# Patient Record
Sex: Female | Born: 1955 | Race: White | Hispanic: No | Marital: Married | Smoking: Never smoker
Health system: Southern US, Community
[De-identification: ages and names within clinical notes are randomized; demographics above are authoritative.]

## PROBLEM LIST (undated history)

## (undated) DIAGNOSIS — R7303 Prediabetes: Secondary | ICD-10-CM

## (undated) DIAGNOSIS — E559 Vitamin D deficiency, unspecified: Secondary | ICD-10-CM

## (undated) DIAGNOSIS — E785 Hyperlipidemia, unspecified: Secondary | ICD-10-CM

## (undated) DIAGNOSIS — J189 Pneumonia, unspecified organism: Secondary | ICD-10-CM

## (undated) DIAGNOSIS — N201 Calculus of ureter: Secondary | ICD-10-CM

## (undated) DIAGNOSIS — R350 Frequency of micturition: Secondary | ICD-10-CM

## (undated) DIAGNOSIS — Z87442 Personal history of urinary calculi: Secondary | ICD-10-CM

## (undated) DIAGNOSIS — K219 Gastro-esophageal reflux disease without esophagitis: Secondary | ICD-10-CM

## (undated) DIAGNOSIS — I10 Essential (primary) hypertension: Secondary | ICD-10-CM

## (undated) HISTORY — PX: CATARACT EXTRACTION W/ INTRAOCULAR LENS IMPLANT: SHX1309

---

## 1996-04-09 HISTORY — PX: TOTAL ABDOMINAL HYSTERECTOMY W/ BILATERAL SALPINGOOPHORECTOMY: SHX83

## 1997-08-19 ENCOUNTER — Other Ambulatory Visit: Admission: RE | Admit: 1997-08-19 | Discharge: 1997-08-19 | Payer: Self-pay | Admitting: Gynecology

## 1998-10-17 ENCOUNTER — Other Ambulatory Visit: Admission: RE | Admit: 1998-10-17 | Discharge: 1998-10-17 | Payer: Self-pay | Admitting: Gynecology

## 1999-12-28 ENCOUNTER — Other Ambulatory Visit: Admission: RE | Admit: 1999-12-28 | Discharge: 1999-12-28 | Payer: Self-pay | Admitting: Gynecology

## 2000-12-18 ENCOUNTER — Other Ambulatory Visit: Admission: RE | Admit: 2000-12-18 | Discharge: 2000-12-18 | Payer: Self-pay | Admitting: Gynecology

## 2001-12-31 ENCOUNTER — Other Ambulatory Visit: Admission: RE | Admit: 2001-12-31 | Discharge: 2001-12-31 | Payer: Self-pay | Admitting: Gynecology

## 2003-01-14 ENCOUNTER — Other Ambulatory Visit: Admission: RE | Admit: 2003-01-14 | Discharge: 2003-01-14 | Payer: Self-pay | Admitting: Gynecology

## 2004-01-14 ENCOUNTER — Encounter: Admission: RE | Admit: 2004-01-14 | Discharge: 2004-01-14 | Payer: Self-pay | Admitting: Gynecology

## 2004-01-17 ENCOUNTER — Other Ambulatory Visit: Admission: RE | Admit: 2004-01-17 | Discharge: 2004-01-17 | Payer: Self-pay | Admitting: Gynecology

## 2004-10-23 ENCOUNTER — Encounter: Admission: RE | Admit: 2004-10-23 | Discharge: 2004-10-23 | Payer: Self-pay | Admitting: Family Medicine

## 2005-02-02 ENCOUNTER — Other Ambulatory Visit: Admission: RE | Admit: 2005-02-02 | Discharge: 2005-02-02 | Payer: Self-pay | Admitting: Gynecology

## 2005-02-22 ENCOUNTER — Encounter: Admission: RE | Admit: 2005-02-22 | Discharge: 2005-02-22 | Payer: Self-pay | Admitting: Gynecology

## 2006-08-08 ENCOUNTER — Other Ambulatory Visit: Admission: RE | Admit: 2006-08-08 | Discharge: 2006-08-08 | Payer: Self-pay | Admitting: Family Medicine

## 2006-08-08 ENCOUNTER — Encounter: Admission: RE | Admit: 2006-08-08 | Discharge: 2006-08-08 | Payer: Self-pay | Admitting: Physician Assistant

## 2009-01-21 ENCOUNTER — Encounter: Admission: RE | Admit: 2009-01-21 | Discharge: 2009-01-21 | Payer: Self-pay | Admitting: Gynecology

## 2009-01-28 ENCOUNTER — Encounter: Admission: RE | Admit: 2009-01-28 | Discharge: 2009-01-28 | Payer: Self-pay | Admitting: Gynecology

## 2010-04-30 ENCOUNTER — Encounter: Payer: Self-pay | Admitting: Gynecology

## 2012-04-03 ENCOUNTER — Ambulatory Visit
Admission: RE | Admit: 2012-04-03 | Discharge: 2012-04-03 | Disposition: A | Discharge status: Home / Self Care | Payer: BC Managed Care – PPO | Source: Ambulatory Visit | Attending: Family Medicine | Admitting: Family Medicine

## 2012-04-03 ENCOUNTER — Other Ambulatory Visit: Payer: Self-pay | Admitting: Family Medicine

## 2012-04-03 DIAGNOSIS — M25531 Pain in right wrist: Secondary | ICD-10-CM

## 2014-01-29 ENCOUNTER — Ambulatory Visit
Admission: RE | Admit: 2014-01-29 | Discharge: 2014-01-29 | Disposition: A | Discharge status: Home / Self Care | Payer: 59 | Source: Ambulatory Visit | Attending: Family Medicine | Admitting: Family Medicine

## 2014-01-29 ENCOUNTER — Other Ambulatory Visit: Payer: Self-pay | Admitting: Family Medicine

## 2014-01-29 DIAGNOSIS — M545 Low back pain: Secondary | ICD-10-CM

## 2014-04-09 HISTORY — PX: CATARACT EXTRACTION W/ INTRAOCULAR LENS IMPLANT: SHX1309

## 2014-05-17 ENCOUNTER — Other Ambulatory Visit: Payer: Self-pay

## 2014-05-17 DIAGNOSIS — Z803 Family history of malignant neoplasm of breast: Secondary | ICD-10-CM

## 2014-05-17 DIAGNOSIS — Z1231 Encounter for screening mammogram for malignant neoplasm of breast: Secondary | ICD-10-CM

## 2014-05-20 ENCOUNTER — Ambulatory Visit
Admission: RE | Admit: 2014-05-20 | Discharge: 2014-05-20 | Disposition: A | Discharge status: Home / Self Care | Payer: 59 | Source: Ambulatory Visit

## 2014-05-20 DIAGNOSIS — Z1231 Encounter for screening mammogram for malignant neoplasm of breast: Secondary | ICD-10-CM

## 2014-05-20 DIAGNOSIS — Z803 Family history of malignant neoplasm of breast: Secondary | ICD-10-CM

## 2015-06-09 ENCOUNTER — Other Ambulatory Visit: Payer: Self-pay

## 2015-06-09 DIAGNOSIS — Z1231 Encounter for screening mammogram for malignant neoplasm of breast: Secondary | ICD-10-CM

## 2015-07-01 ENCOUNTER — Ambulatory Visit
Admission: RE | Admit: 2015-07-01 | Discharge: 2015-07-01 | Disposition: A | Discharge status: Home / Self Care | Payer: 59 | Source: Ambulatory Visit

## 2015-07-01 DIAGNOSIS — Z1231 Encounter for screening mammogram for malignant neoplasm of breast: Secondary | ICD-10-CM

## 2016-04-09 DEATH — deceased

## 2017-06-20 ENCOUNTER — Other Ambulatory Visit: Payer: Self-pay | Admitting: Family Medicine

## 2017-06-20 DIAGNOSIS — Z1231 Encounter for screening mammogram for malignant neoplasm of breast: Secondary | ICD-10-CM

## 2017-06-24 ENCOUNTER — Ambulatory Visit
Admission: RE | Admit: 2017-06-24 | Discharge: 2017-06-24 | Disposition: A | Discharge status: Home / Self Care | Payer: 59 | Source: Ambulatory Visit | Attending: Family Medicine | Admitting: Family Medicine

## 2017-06-24 DIAGNOSIS — Z1231 Encounter for screening mammogram for malignant neoplasm of breast: Secondary | ICD-10-CM

## 2017-09-07 ENCOUNTER — Emergency Department (HOSPITAL_COMMUNITY): Payer: 59

## 2017-09-07 ENCOUNTER — Encounter (HOSPITAL_COMMUNITY): Payer: Self-pay | Admitting: Emergency Medicine

## 2017-09-07 ENCOUNTER — Emergency Department (HOSPITAL_COMMUNITY)
Admission: EM | Admit: 2017-09-07 | Discharge: 2017-09-07 | Disposition: A | Discharge status: Home / Self Care | Payer: 59 | Attending: Emergency Medicine | Admitting: Emergency Medicine

## 2017-09-07 DIAGNOSIS — N23 Unspecified renal colic: Secondary | ICD-10-CM | POA: Diagnosis not present

## 2017-09-07 DIAGNOSIS — R1084 Generalized abdominal pain: Secondary | ICD-10-CM | POA: Diagnosis present

## 2017-09-07 LAB — I-STAT CHEM 8, ED
BUN: 19 mg/dL (ref 6–20)
Calcium, Ion: 1.1 mmol/L — ABNORMAL LOW (ref 1.15–1.40)
Chloride: 102 mmol/L (ref 101–111)
Creatinine, Ser: 0.9 mg/dL (ref 0.44–1.00)
Glucose, Bld: 160 mg/dL — ABNORMAL HIGH (ref 65–99)
HCT: 40 % (ref 36.0–46.0)
Hemoglobin: 13.6 g/dL (ref 12.0–15.0)
Potassium: 3.7 mmol/L (ref 3.5–5.1)
Sodium: 140 mmol/L (ref 135–145)
TCO2: 25 mmol/L (ref 22–32)

## 2017-09-07 LAB — URINALYSIS, ROUTINE W REFLEX MICROSCOPIC
Bacteria, UA: NONE SEEN
Bilirubin Urine: NEGATIVE
Glucose, UA: NEGATIVE mg/dL
Ketones, ur: NEGATIVE mg/dL
Nitrite: NEGATIVE
Protein, ur: NEGATIVE mg/dL
Specific Gravity, Urine: 1.013 (ref 1.005–1.030)
pH: 7 (ref 5.0–8.0)

## 2017-09-07 MED ORDER — ONDANSETRON HCL 4 MG/2ML IJ SOLN
4.0000 mg | Freq: Once | INTRAMUSCULAR | Status: AC
  Administered 2017-09-07: 4 mg via INTRAVENOUS
  Filled 2017-09-07: qty 2

## 2017-09-07 MED ORDER — HYDROMORPHONE HCL 2 MG/ML IJ SOLN
1.0000 mg | Freq: Once | INTRAMUSCULAR | Status: AC
  Administered 2017-09-07: 1 mg via INTRAVENOUS
  Filled 2017-09-07: qty 1

## 2017-09-07 MED ORDER — FENTANYL CITRATE (PF) 100 MCG/2ML IJ SOLN
50.0000 ug | INTRAMUSCULAR | Status: AC | PRN
  Administered 2017-09-07 (×2): 50 ug via INTRAVENOUS
  Filled 2017-09-07 (×2): qty 2

## 2017-09-07 MED ORDER — OXYCODONE-ACETAMINOPHEN 5-325 MG PO TABS: 1.0000 | ORAL_TABLET | ORAL | 0 refills | Status: DC | PRN

## 2017-09-07 MED ORDER — OXYCODONE-ACETAMINOPHEN 5-325 MG PO TABS
1.0000 | ORAL_TABLET | Freq: Once | ORAL | Status: DC
  Filled 2017-09-07: qty 1

## 2017-09-07 NOTE — ED Triage Notes (Signed)
Pt began having left sided flank pain.  Pt went to urgent care earlier, was told it could be a kidney infection however the pain decreased .  Pt stated around 11:30pm the pain started back, pt is unable to sit still.  She feels like she needs to urinate but is unable to.

## 2017-09-07 NOTE — ED Notes (Addendum)
Pt called x3 for vitals reassessment with no response.

## 2017-09-07 NOTE — ED Provider Notes (Signed)
[facility] Provider Note   CSN: [id number] Arrival date & time: 09/07/17  0126     History   Chief Complaint Chief Complaint  Patient presents with  . Flank Pain    HPI Melissa Hobbs is a 62 y.o. female.  Patient presents to the emergency department for evaluation of left flank pain.  Patient reports that she had some pain in the middle of the night last night, but it eased off.  She went to work today and was doing fine initially but started having pain in her left side while at work that continued through the day.  Tonight she was awakened from sleep by severe, constant, sharp and stabbing pain in the left flank with nausea but no vomiting.     History reviewed. No pertinent past medical history.  There are no active problems to display for this patient.   History reviewed. No pertinent surgical history.   OB History   None      Home Medications    Prior to Admission medications   Not on File    Family History No family history on file.  Social History Social History   Tobacco Use  . Smoking status: Not on file  Substance Use Topics  . Alcohol use: Not on file  . Drug use: Not on file     Allergies   Penicillins   Review of Systems Review of Systems  Gastrointestinal: Positive for nausea.  Genitourinary: Positive for flank pain.  All other systems reviewed and are negative.    Physical Exam Updated Vital Signs BP (!) 169/116   Pulse 88   Temp 97.7 F (36.5 C) (Oral)   Resp 20   SpO2 99%   Physical Exam  Constitutional: She is oriented to person, place, and time. She appears well-developed and well-nourished. She appears distressed.  HENT:  Head: Normocephalic and atraumatic.  Right Ear: Hearing normal.  Left Ear: Hearing normal.  Nose: Nose normal.  Mouth/Throat: Oropharynx is clear and moist and mucous membranes are normal.  Eyes: Pupils are equal, round, and reactive to light. Conjunctivae  and EOM are normal.  Neck: Normal range of motion. Neck supple.  Cardiovascular: Regular rhythm, S1 normal and S2 normal. Exam reveals no gallop and no friction rub.  No murmur heard. Pulmonary/Chest: Effort normal and breath sounds normal. No respiratory distress. She exhibits no tenderness.  Abdominal: Soft. Normal appearance and bowel sounds are normal. There is no hepatosplenomegaly. There is no tenderness. There is no rebound, no guarding, no tenderness at McBurney's point and negative Murphy's sign. No hernia.  Musculoskeletal: Normal range of motion.  Neurological: She is alert and oriented to person, place, and time. She has normal strength. No cranial nerve deficit or sensory deficit. Coordination normal. GCS eye subscore is 4. GCS verbal subscore is 5. GCS motor subscore is 6.  Skin: Skin is warm, dry and intact. No rash noted. No cyanosis.  Psychiatric: She has a normal mood and affect. Her speech is normal and behavior is normal. Thought content normal.  Nursing note and vitals reviewed.    ED Treatments / Results  Labs (all labs ordered are listed, but only abnormal results are displayed) Labs Reviewed  URINALYSIS, ROUTINE W REFLEX MICROSCOPIC - Abnormal; Notable for the following components:      Result Value   APPearance TURBID (*)    Hgb urine dipstick SMALL (*)    Leukocytes, UA TRACE (*)    All other  components within normal limits    EKG None  Radiology Ct Renal Stone Study  Result Date: 09/07/2017 CLINICAL DATA:  Acute onset of left flank pain. EXAM: CT ABDOMEN AND PELVIS WITHOUT CONTRAST TECHNIQUE: Multidetector CT imaging of the abdomen and pelvis was performed following the standard protocol without IV contrast. COMPARISON:  Lumbar spine radiographs performed 01/29/2014 FINDINGS: Lower chest: The visualized lung bases are grossly clear. The visualized portions of the mediastinum are unremarkable. Hepatobiliary: The liver is unremarkable in appearance. The  gallbladder is unremarkable in appearance. The common bile duct remains normal in caliber. Pancreas: The pancreas is within normal limits. Spleen: The spleen is unremarkable in appearance. Adrenals/Urinary Tract: The adrenal glands are unremarkable in appearance. There is mild left-sided hydronephrosis. An obstructing 2 mm stone is noted at the base of the bladder, along the left vesicoureteral junction. A relatively small staghorn calculus is noted at the right kidney, measuring 1.8 cm. Additional scattered nonobstructing bilateral renal stones are seen. Stomach/Bowel: The stomach is unremarkable in appearance. The small bowel is within normal limits. The appendix is normal in caliber, without evidence of appendicitis. The colon is unremarkable in appearance. Vascular/Lymphatic: Minimal calcification is noted at the distal abdominal aorta and its branches. No retroperitoneal or pelvic sidewall lymphadenopathy is seen. Reproductive: The bladder is mildly distended and grossly unremarkable. The patient is status post hysterectomy. No suspicious adnexal masses are seen. Other: No additional soft tissue abnormalities are seen. Musculoskeletal: No acute osseous abnormalities are identified. The visualized musculature is unremarkable in appearance. IMPRESSION: 1. Mild left-sided hydronephrosis, with an obstructing 2 mm stone noted at the base of the bladder, along the left vesicoureteral junction. 2. Additional scattered nonobstructing bilateral renal stones seen. Relatively small staghorn calculus at the right kidney, measuring 1.8 cm. Electronically Signed   By: Roanna Raider M.D.   On: 09/07/2017 03:57    Procedures Procedures (including critical care time)  Medications Ordered in ED Medications  HYDROmorphone (DILAUDID) injection 1 mg (has no administration in time range)  ondansetron (ZOFRAN) injection 4 mg (has no administration in time range)  fentaNYL (SUBLIMAZE) injection 50 mcg (50 mcg Intravenous  Given 09/07/17 0322)  ondansetron (ZOFRAN) injection 4 mg (4 mg Intravenous Given 09/07/17 0201)     Initial Impression / Assessment and Plan / ED Course  I have reviewed the triage vital signs and the nursing notes.  Pertinent labs & imaging results that were available during my care of the patient were reviewed by me and considered in my medical decision making (see chart for details).     Patient presents to the emergency department for evaluation of left flank pain.  She has had intermittent pain for 24 hours but it is now constant and severe.  CT scan confirms 2 mm distal ureteral stone with mild hydronephrosis causing her pain.  No evidence of urinary tract infection.  Patient provided with analgesia, will follow up with urology.  Final Clinical Impressions(s) / ED Diagnoses   Final diagnoses:  Ureteral colic    ED Discharge Orders    None       Gilda Crease, MD 09/07/17 [phone]

## 2019-01-07 ENCOUNTER — Other Ambulatory Visit: Payer: Self-pay | Admitting: Family Medicine

## 2019-01-07 DIAGNOSIS — R109 Unspecified abdominal pain: Secondary | ICD-10-CM

## 2020-05-27 ENCOUNTER — Ambulatory Visit (HOSPITAL_COMMUNITY): Payer: 59

## 2020-05-27 ENCOUNTER — Inpatient Hospital Stay (HOSPITAL_COMMUNITY)
Admission: EM | Admit: 2020-05-27 | Discharge: 2020-05-30 | DRG: 177 | Disposition: A | Discharge status: Home / Self Care | Payer: 59 | Attending: Internal Medicine | Admitting: Internal Medicine

## 2020-05-27 ENCOUNTER — Encounter (HOSPITAL_COMMUNITY): Payer: Self-pay

## 2020-05-27 ENCOUNTER — Ambulatory Visit (HOSPITAL_COMMUNITY)
Admission: EM | Admit: 2020-05-27 | Discharge: 2020-05-27 | Disposition: A | Discharge status: Home / Self Care | Payer: 59 | Attending: Physician Assistant | Admitting: Physician Assistant

## 2020-05-27 ENCOUNTER — Other Ambulatory Visit: Payer: Self-pay

## 2020-05-27 ENCOUNTER — Ambulatory Visit (INDEPENDENT_AMBULATORY_CARE_PROVIDER_SITE_OTHER): Payer: 59

## 2020-05-27 DIAGNOSIS — U071 COVID-19: Principal | ICD-10-CM | POA: Diagnosis present

## 2020-05-27 DIAGNOSIS — J9601 Acute respiratory failure with hypoxia: Secondary | ICD-10-CM | POA: Diagnosis present

## 2020-05-27 DIAGNOSIS — Z90710 Acquired absence of both cervix and uterus: Secondary | ICD-10-CM | POA: Diagnosis not present

## 2020-05-27 DIAGNOSIS — I10 Essential (primary) hypertension: Secondary | ICD-10-CM | POA: Diagnosis present

## 2020-05-27 DIAGNOSIS — Z880 Allergy status to penicillin: Secondary | ICD-10-CM | POA: Diagnosis not present

## 2020-05-27 DIAGNOSIS — Z79899 Other long term (current) drug therapy: Secondary | ICD-10-CM

## 2020-05-27 DIAGNOSIS — J1282 Pneumonia due to coronavirus disease 2019: Secondary | ICD-10-CM

## 2020-05-27 DIAGNOSIS — R0602 Shortness of breath: Secondary | ICD-10-CM

## 2020-05-27 DIAGNOSIS — R0902 Hypoxemia: Secondary | ICD-10-CM

## 2020-05-27 HISTORY — DX: Essential (primary) hypertension: I10

## 2020-05-27 LAB — COMPREHENSIVE METABOLIC PANEL WITH GFR
ALT: 37 U/L (ref 0–44)
AST: 41 U/L (ref 15–41)
Albumin: 3.1 g/dL — ABNORMAL LOW (ref 3.5–5.0)
Alkaline Phosphatase: 64 U/L (ref 38–126)
Anion gap: 12 (ref 5–15)
BUN: 23 mg/dL (ref 8–23)
CO2: 27 mmol/L (ref 22–32)
Calcium: 8.8 mg/dL — ABNORMAL LOW (ref 8.9–10.3)
Chloride: 99 mmol/L (ref 98–111)
Creatinine, Ser: 0.96 mg/dL (ref 0.44–1.00)
GFR, Estimated: >60 mL/min
Glucose, Bld: 121 mg/dL — ABNORMAL HIGH (ref 70–99)
Potassium: 4.1 mmol/L (ref 3.5–5.1)
Sodium: 138 mmol/L (ref 135–145)
Total Bilirubin: 0.6 mg/dL (ref 0.3–1.2)
Total Protein: 6.6 g/dL (ref 6.5–8.1)

## 2020-05-27 LAB — CBC WITH DIFFERENTIAL/PLATELET
Abs Immature Granulocytes: 0 K/uL (ref 0.00–0.07)
Basophils Absolute: 0 K/uL (ref 0.0–0.1)
Basophils Relative: 0 %
Eosinophils Absolute: 0 K/uL (ref 0.0–0.5)
Eosinophils Relative: 0 %
HCT: 39.8 % (ref 36.0–46.0)
Hemoglobin: 13.7 g/dL (ref 12.0–15.0)
Lymphocytes Relative: 15 %
Lymphs Abs: 1 K/uL (ref 0.7–4.0)
MCH: 30.8 pg (ref 26.0–34.0)
MCHC: 34.4 g/dL (ref 30.0–36.0)
MCV: 89.4 fL (ref 80.0–100.0)
Monocytes Absolute: 0.4 K/uL (ref 0.1–1.0)
Monocytes Relative: 6 %
Neutro Abs: 5.1 K/uL (ref 1.7–7.7)
Neutrophils Relative %: 79 %
Platelets: 222 K/uL (ref 150–400)
RBC: 4.45 MIL/uL (ref 3.87–5.11)
RDW: 11.6 % (ref 11.5–15.5)
WBC: 6.4 K/uL (ref 4.0–10.5)
nRBC: 0 % (ref 0.0–0.2)
nRBC: 0 /100{WBCs}

## 2020-05-27 LAB — LACTIC ACID, PLASMA
Lactic Acid, Venous: 1.7 mmol/L (ref 0.5–1.9)
Lactic Acid, Venous: 1.4 mmol/L (ref 0.5–1.9)

## 2020-05-27 LAB — TROPONIN I (HIGH SENSITIVITY)
Troponin I (High Sensitivity): 7 ng/L (ref <18)
Troponin I (High Sensitivity): 7 ng/L

## 2020-05-27 LAB — D-DIMER, QUANTITATIVE: D-Dimer, Quant: 1.62 ug/mL-FEU — ABNORMAL HIGH (ref 0.00–0.50)

## 2020-05-27 LAB — POC SARS CORONAVIRUS 2 AG -  ED: SARS Coronavirus 2 Ag: NEGATIVE

## 2020-05-27 LAB — TRIGLYCERIDES: Triglycerides: 180 mg/dL — ABNORMAL HIGH

## 2020-05-27 LAB — LACTATE DEHYDROGENASE: LDH: 313 U/L — ABNORMAL HIGH (ref 98–192)

## 2020-05-27 LAB — FERRITIN: Ferritin: 956 ng/mL — ABNORMAL HIGH (ref 11–307)

## 2020-05-27 LAB — C-REACTIVE PROTEIN: CRP: 9.4 mg/dL — ABNORMAL HIGH

## 2020-05-27 LAB — FIBRINOGEN: Fibrinogen: 700 mg/dL — ABNORMAL HIGH (ref 210–475)

## 2020-05-27 LAB — PROCALCITONIN: Procalcitonin: <0.1 ng/mL

## 2020-05-27 MED ORDER — DEXAMETHASONE 6 MG PO TABS: 6.0000 mg | ORAL_TABLET | ORAL | Status: DC

## 2020-05-27 MED ORDER — ACETAMINOPHEN 325 MG PO TABS: 650.0000 mg | ORAL_TABLET | Freq: Four times a day (QID) | ORAL | Status: DC | PRN

## 2020-05-27 MED ORDER — ALBUTEROL SULFATE HFA 108 (90 BASE) MCG/ACT IN AERS
4.0000 | INHALATION_SPRAY | Freq: Once | RESPIRATORY_TRACT | Status: AC
  Administered 2020-05-27: 4 via RESPIRATORY_TRACT

## 2020-05-27 MED ORDER — SODIUM CHLORIDE 0.9 % IV SOLN
100.0000 mg | Freq: Every day | INTRAVENOUS | Status: DC
  Administered 2020-05-28 – 2020-05-30 (×3): 100 mg via INTRAVENOUS
  Filled 2020-05-27 (×4): qty 20

## 2020-05-27 MED ORDER — SODIUM CHLORIDE 0.9 % IV SOLN
200.0000 mg | Freq: Once | INTRAVENOUS | Status: AC
  Administered 2020-05-27: 200 mg via INTRAVENOUS
  Filled 2020-05-27: qty 40

## 2020-05-27 MED ORDER — ALBUTEROL SULFATE HFA 108 (90 BASE) MCG/ACT IN AERS: 4.0000 | INHALATION_SPRAY | Freq: Once | RESPIRATORY_TRACT | Status: AC

## 2020-05-27 MED ORDER — AEROCHAMBER PLUS FLO-VU LARGE MISC
1.0000 | Freq: Once | Status: AC
  Administered 2020-05-27: 1

## 2020-05-27 MED ORDER — ENOXAPARIN SODIUM 40 MG/0.4ML ~~LOC~~ SOLN
40.0000 mg | SUBCUTANEOUS | Status: DC
  Administered 2020-05-27 – 2020-05-29 (×3): 40 mg via SUBCUTANEOUS
  Filled 2020-05-27 (×3): qty 0.4

## 2020-05-27 MED ORDER — ALBUTEROL SULFATE HFA 108 (90 BASE) MCG/ACT IN AERS
INHALATION_SPRAY | RESPIRATORY_TRACT | Status: AC
  Filled 2020-05-27: qty 6.7

## 2020-05-27 MED ORDER — DEXAMETHASONE SODIUM PHOSPHATE 10 MG/ML IJ SOLN
6.0000 mg | Freq: Once | INTRAMUSCULAR | Status: AC
  Administered 2020-05-27: 6 mg via INTRAVENOUS
  Filled 2020-05-27: qty 1

## 2020-05-27 MED ORDER — AEROCHAMBER PLUS FLO-VU LARGE MISC
Status: AC
  Filled 2020-05-27: qty 1

## 2020-05-27 NOTE — ED Notes (Signed)
Pt reports that she subjectively feels less SOB, able to speak full sentences with less dyspnea than on arrival

## 2020-05-27 NOTE — H&P (Signed)
History and Physical:    Melissa Hobbs   OIZ:[id number] DOB: 1956/01/06 DOA: 05/27/2020  Referring MD/provider: Dr. Mellody Dance PCP: [facility]   Patient coming from: Home  Chief Complaint: Fevers, chills, shortness of breath  History of Present Illness:   Melissa Hobbs is an 65 y.o. unvaccinated female who was diagnosed with SARS-CoV-2 on 05/18/2020.  Patient initially thought she was doing well although she does admit to decreased p.o. intake secondary to anorexia.  He also had some diarrhea which seems to have now resolved.  She also had some nausea.  However she thought she was doing okay until yesterday when she developed fevers and chills with associated shortness of breath and cough.  She was seen by a telemedicine doctor earlier today who told her to come into the ED.  Patient denies any abdominal pain.  Notes that her diarrhea has mostly tapered off.  She admits to some intermittent nausea.  No vomiting.  She has had decreased p.o. intake.  ED Course:  The patient was noted to be afebrile and normotensive.  She was noted to have O2 saturation of 87% on room air which corrected to the mid 90s on 3-4 L of nasal cannula.  ROS:   ROS   Review of Systems: As per HPI  Past Medical History:   Past Medical History:  Diagnosis Date  . Hypertension     Past Surgical History:   Past Surgical History:  Procedure Laterality Date  . ABDOMINAL HYSTERECTOMY      Social History:   Social History   Socioeconomic History  . Marital status: Married    Spouse name: Not on file  . Number of children: Not on file  . Years of education: Not on file  . Highest education level: Not on file  Occupational History  . Not on file  Tobacco Use  . Smoking status: Never Smoker  . Smokeless tobacco: Never Used  Vaping Use  . Vaping Use: Never used  Substance and Sexual Activity  . Alcohol use: Never  . Drug use: Never  . Sexual activity: Not on file  Other  Topics Concern  . Not on file  Social History Narrative  . Not on file   Social Determinants of Health   Financial Resource Strain: Not on file  Food Insecurity: Not on file  Transportation Needs: Not on file  Physical Activity: Not on file  Stress: Not on file  Social Connections: Not on file  Intimate Partner Violence: Not on file    Allergies   Penicillins  Family history:   No family history on file.  Current Medications:   Prior to Admission medications   Medication Sig Start Date End Date Taking? Authorizing Provider  calcium carbonate (TUMS - DOSED IN MG ELEMENTAL CALCIUM) 500 MG chewable tablet Chew 1 tablet by mouth daily.   Yes [provider]  Cholecalciferol (VITAMIN D3) 25 MCG (1000 UT) CAPS Take 1,000 Units by mouth daily.   Yes [provider]  amLODipine (NORVASC) 5 MG tablet Take 5 mg by mouth daily. Patient not taking: No sig reported 12/31/19   [provider]  oxyCODONE-acetaminophen (PERCOCET) 5-325 MG tablet Take 1 tablet by mouth every 4 (four) hours as needed. Patient not taking: Reported on 05/27/2020 09/07/17   Gilda Crease, MD    Physical Exam:   Vitals:   05/27/20 1800 05/27/20 1815 05/27/20 1830 05/27/20 1845  BP: 121/63 119/64 122/67 (!) 109/98  Pulse: 87 91 88 88  Resp: (!) 30 (!) 31 (!) 30 (!) 28  Temp:      SpO2: 93% 94% 94% 98%     Physical Exam: Blood pressure (!) 109/98, pulse 88, temperature 98.6 F (37 C), resp. rate (!) 28, SpO2 98 %. Gen: Patient appearing much older than stated age lying flat in bed in no acute distress. Eyes: sclera anicteric, conjuctiva mildly injected bilaterally CVS: S1-S2, regulary, no gallops Respiratory:  decreased air entry likely secondary to decreased inspiratory effort GI: NABS, soft, NT  LE: No edema. No cyanosis Neuro: grossly nonfocal.  Psych:mood and affect appropriate to situation.   Data Review:    Labs: Basic Metabolic Panel: Recent Labs  Lab  05/27/20 1542  NA 138  K 4.1  CL 99  CO2 27  GLUCOSE 121*  BUN 23  CREATININE 0.96  CALCIUM 8.8*   Liver Function Tests: Recent Labs  Lab 05/27/20 1542  AST 41  ALT 37  ALKPHOS 64  BILITOT 0.6  PROT 6.6  ALBUMIN 3.1*   No results for input(s): LIPASE, AMYLASE in the last 168 hours. No results for input(s): AMMONIA in the last 168 hours. CBC: Recent Labs  Lab 05/27/20 1542  WBC 6.4  NEUTROABS 5.1  HGB 13.7  HCT 39.8  MCV 89.4  PLT 222   Cardiac Enzymes: No results for input(s): CKTOTAL, CKMB, CKMBINDEX, TROPONINI in the last 168 hours.  BNP (last 3 results) No results for input(s): PROBNP in the last 8760 hours. CBG: No results for input(s): GLUCAP in the last 168 hours.  Urinalysis    Component Value Date/Time   COLORURINE YELLOW 09/07/2017 0147   APPEARANCEUR TURBID (A) 09/07/2017 0147   LABSPEC 1.013 09/07/2017 0147   PHURINE 7.0 09/07/2017 0147   GLUCOSEU NEGATIVE 09/07/2017 0147   HGBUR SMALL (A) 09/07/2017 0147   BILIRUBINUR NEGATIVE 09/07/2017 0147   KETONESUR NEGATIVE 09/07/2017 0147   PROTEINUR NEGATIVE 09/07/2017 0147   NITRITE NEGATIVE 09/07/2017 0147   LEUKOCYTESUR TRACE (A) 09/07/2017 0147      Radiographic Studies: DG Chest 2 View  Result Date: 05/27/2020 CLINICAL DATA:  Shortness of breath and decreased oxygen saturation. The patient tested positive for COVID-19 9 days ago. EXAM: CHEST - 2 VIEW COMPARISON:  None. FINDINGS: The patient has multifocal bilateral airspace disease with a peripheral predominance. No pneumothorax or pleural effusion. Heart size is normal. No acute or focal bony abnormality. IMPRESSION: Bilateral pneumonia has an appearance compatible with COVID-19 infection. Electronically Signed   By: Drusilla Kanner M.D.   On: 05/27/2020 14:38    EKG: Independently reviewed.  Sinus rhythm at 90.  Normal intervals.  Normal axis.  No acute ST-T wave changes.   Assessment/Plan:   Active Problems:   Pneumonia due to  COVID-33 virus  65 year old unvaccinated female on no medications at home is admitted with COVID-19 pneumonia.  COVID-19 pneumonia Patient was started on remdesivir and Decadron in the ED which I am continuing. She does have a D-dimer of 1.62, but given associated fevers and chills prior to the shortness of breath, I have low suspicion for pulmonary emboli.  I have placed patient on prophylactic doses of Lovenox. CRP is 9.4 however patient looks relatively comfortable, I have not started Actemra or antibodies Procalcitonin is less than 0.10    Other information:   DVT prophylaxis: Lovenox ordered. Code Status: Full Family Communication: None Disposition Plan: Home Consults called: None Admission status: Inpatient  Camylle Whicker [place] [facility]  If 7PM-7AM, please contact night-coverage [email or web address] Password [facility] 05/27/2020, 7:28 PM

## 2020-05-27 NOTE — ED Triage Notes (Signed)
Pt c/o productive cough with white sputum, chills, nausea for approx 9 days and was covid positive last Wednesday. Reports SOB onset yesterday and temp of 103 yesterday.  Tachypnea noted and conversational dyspnea, O2Sat 86%-89% on RA, RR 32, coarse sounds anterior right side, diminished exchange bilateral bases.  No tylenol or other antipyretics today. Reports she took her sister's "inhaler" this morning at approx 0900 with some improvement to SOB. Denies CP, n/v/d, diaphoresis, dizziness.  Notified K. Sofia of pt status, verbal orders for 2 puffs albuterol with spacer, CXR received.

## 2020-05-27 NOTE — Discharge Instructions (Signed)
Go to the Hospital for evaluation

## 2020-05-27 NOTE — ED Provider Notes (Signed)
[facility] EMERGENCY DEPARTMENT Provider Note   CSN: [id number] Arrival date & time: 05/27/20  1524     History Chief Complaint  Patient presents with  . Covid Positive    PNA    Melissa Hobbs is a 65 y.o. female with h/o HTN who presents to the ED for SOB and hypoxia. Diagnosed with COVID on 05/18/20. Day 9 of symptoms. Progressively worsening SOB and cough. Fever Tmax 103F temporally at home. Seen at UC earlier today for worsening symptoms and found to be hypoxic to 86% on RA and sent to ED for further evaluation. Denies chest pain, headache, N/V/D, or abdominal pain. Did not receive COVID vaccination.  The history is provided by the patient and medical records.  Shortness of Breath Severity:  Moderate Onset quality:  Gradual Duration:  9 days Timing:  Constant Progression:  Worsening Chronicity:  New Context: URI   Relieved by:  Rest Worsened by:  Coughing Ineffective treatments:  None tried Associated symptoms: cough and fever   Associated symptoms: no abdominal pain, no chest pain, no diaphoresis, no ear pain, no hemoptysis, no rash, no sore throat, no sputum production, no vomiting and no wheezing   Risk factors: no hx of PE/DVT and no tobacco use        Past Medical History:  Diagnosis Date  . Hypertension     Patient Active Problem List   Diagnosis Date Noted  . Pneumonia due to COVID-19 virus 05/27/2020    Past Surgical History:  Procedure Laterality Date  . ABDOMINAL HYSTERECTOMY       OB History   No obstetric history on file.     No family history on file.  Social History   Tobacco Use  . Smoking status: Never Smoker  . Smokeless tobacco: Never Used  Vaping Use  . Vaping Use: Never used  Substance Use Topics  . Alcohol use: Never  . Drug use: Never    Home Medications Prior to Admission medications   Medication Sig Start Date End Date Taking? Authorizing Provider  calcium carbonate (TUMS - DOSED IN MG ELEMENTAL  CALCIUM) 500 MG chewable tablet Chew 1 tablet by mouth daily.   Yes [provider]  Cholecalciferol (VITAMIN D3) 25 MCG (1000 UT) CAPS Take 1,000 Units by mouth daily.   Yes [provider]  amLODipine (NORVASC) 5 MG tablet Take 5 mg by mouth daily. Patient not taking: No sig reported 12/31/19   [provider]  oxyCODONE-acetaminophen (PERCOCET) 5-325 MG tablet Take 1 tablet by mouth every 4 (four) hours as needed. Patient not taking: Reported on 05/27/2020 09/07/17   Gilda Crease, MD    Allergies    Penicillins  Review of Systems   Review of Systems  Constitutional: Positive for fever. Negative for chills and diaphoresis.  HENT: Negative for ear pain and sore throat.   Eyes: Negative for pain and visual disturbance.  Respiratory: Positive for cough and shortness of breath. Negative for hemoptysis, sputum production and wheezing.   Cardiovascular: Negative for chest pain and palpitations.  Gastrointestinal: Negative for abdominal pain and vomiting.  Genitourinary: Negative for dysuria and hematuria.  Musculoskeletal: Negative for arthralgias and back pain.  Skin: Negative for color change and rash.  Neurological: Negative for seizures and syncope.  All other systems reviewed and are negative.   Physical Exam Updated Vital Signs BP 128/76   Pulse 88   Temp 98.6 F (37 C)   Resp (!) 26   Ht  5\' 1"  (1.549 m)   Wt 68 kg   SpO2 91%   BMI 28.34 kg/m   Physical Exam Vitals and nursing note reviewed.  Constitutional:      General: She is awake. She is not in acute distress.    Appearance: Normal appearance. She is well-developed, well-groomed and well-nourished. She is not ill-appearing.  HENT:     Head: Normocephalic and atraumatic.     Right Ear: External ear normal.     Left Ear: External ear normal.     Nose: Nose normal.     Mouth/Throat:     Mouth: Mucous membranes are moist.     Pharynx: Oropharynx is clear. No oropharyngeal exudate  or posterior oropharyngeal erythema.  Eyes:     General: No scleral icterus.       Right eye: No discharge.        Left eye: No discharge.     Conjunctiva/sclera: Conjunctivae normal.  Cardiovascular:     Rate and Rhythm: Normal rate and regular rhythm.     Pulses: Normal pulses.     Heart sounds: Normal heart sounds. No murmur heard.   Pulmonary:     Effort: Pulmonary effort is normal. No respiratory distress.     Breath sounds: Normal breath sounds. No wheezing, rhonchi or rales.  Abdominal:     General: Abdomen is flat. There is no distension.     Palpations: Abdomen is soft.     Tenderness: There is no abdominal tenderness. There is no guarding or rebound.  Musculoskeletal:        General: No edema.     Cervical back: Neck supple.     Right lower leg: No edema.     Left lower leg: No edema.  Skin:    General: Skin is warm and dry.     Findings: No rash.  Neurological:     General: No focal deficit present.     Mental Status: She is alert and oriented to person, place, and time.     Sensory: No sensory deficit.     Motor: No weakness.  Psychiatric:        Mood and Affect: Mood and affect and mood normal.        Behavior: Behavior normal. Behavior is cooperative.     ED Results / Procedures / Treatments   Labs (all labs ordered are listed, but only abnormal results are displayed) Labs Reviewed  COMPREHENSIVE METABOLIC PANEL - Abnormal; Notable for the following components:      Result Value   Glucose, Bld 121 (*)    Calcium 8.8 (*)    Albumin 3.1 (*)    All other components within normal limits  D-DIMER, QUANTITATIVE - Abnormal; Notable for the following components:   D-Dimer, Quant 1.62 (*)    All other components within normal limits  LACTATE DEHYDROGENASE - Abnormal; Notable for the following components:   LDH 313 (*)    All other components within normal limits  FERRITIN - Abnormal; Notable for the following components:   Ferritin 956 (*)    All other  components within normal limits  TRIGLYCERIDES - Abnormal; Notable for the following components:   Triglycerides 180 (*)    All other components within normal limits  FIBRINOGEN - Abnormal; Notable for the following components:   Fibrinogen 700 (*)    All other components within normal limits  C-REACTIVE PROTEIN - Abnormal; Notable for the following components:   CRP 9.4 (*)  All other components within normal limits  CULTURE, BLOOD (ROUTINE X 2)  CULTURE, BLOOD (ROUTINE X 2)  SARS CORONAVIRUS 2 (TAT 6-24 HRS)  LACTIC ACID, PLASMA  LACTIC ACID, PLASMA  CBC WITH DIFFERENTIAL/PLATELET  PROCALCITONIN  HIV ANTIBODY (ROUTINE TESTING W REFLEX)  CBC  C-REACTIVE PROTEIN  COMPREHENSIVE METABOLIC PANEL  CBC WITH DIFFERENTIAL/PLATELET  D-DIMER, QUANTITATIVE  POC SARS CORONAVIRUS 2 AG -  ED  TROPONIN I (HIGH SENSITIVITY)  TROPONIN I (HIGH SENSITIVITY)    EKG EKG Interpretation  Date/Time:  Friday May 27 2020 15:36:55 EST Ventricular Rate:  92 PR Interval:  158 QRS Duration: 78 QT Interval:  378 QTC Calculation: 467 R Axis:   30 Text Interpretation: Normal sinus rhythm nonspecific T wave flattening No old tracing to compare Confirmed by Pricilla Loveless [phone]) on 05/27/2020 4:00:20 PM   Radiology DG Chest 2 View  Result Date: 05/27/2020 CLINICAL DATA:  Shortness of breath and decreased oxygen saturation. The patient tested positive for COVID-19 9 days ago. EXAM: CHEST - 2 VIEW COMPARISON:  None. FINDINGS: The patient has multifocal bilateral airspace disease with a peripheral predominance. No pneumothorax or pleural effusion. Heart size is normal. No acute or focal bony abnormality. IMPRESSION: Bilateral pneumonia has an appearance compatible with COVID-19 infection. Electronically Signed   By: Drusilla Kanner M.D.   On: 05/27/2020 14:38    Procedures Procedures  Medications Ordered in ED Medications  remdesivir 200 mg in sodium chloride 0.9% 250 mL IVPB (0 mg  Intravenous Stopped 05/27/20 2041)    Followed by  remdesivir 100 mg in sodium chloride 0.9 % 100 mL IVPB (has no administration in time range)  enoxaparin (LOVENOX) injection 40 mg (40 mg Subcutaneous Given 05/27/20 2224)  dexamethasone (DECADRON) tablet 6 mg (has no administration in time range)  acetaminophen (TYLENOL) tablet 650 mg (has no administration in time range)  dexamethasone (DECADRON) injection 6 mg (6 mg Intravenous Given 05/27/20 1847)    ED Course  I have reviewed the triage vital signs and the nursing notes.  Pertinent labs & imaging results that were available during my care of the patient were reviewed by me and considered in my medical decision making (see chart for details).    MDM Rules/Calculators/A&P                          Patient is a [phone] with history and physical as described above who presents to the ED for hypoxia and SOB. VS reassuring and HDS. Satting well on 3L [place] and no acute respiratory distress. Initial workup started in triage. Initial treatment includes Decadron and remdesivir. CXR w/ BLT PNA consistent with COVID infection. Given new O2 requirement, patient warrants admission for further treatment and management. Discussed patient with Hospitalist who will admit. Patient otherwise remained HDS with no acute events during ED course.  Final Clinical Impression(s) / ED Diagnoses Final diagnoses:  COVID-19  Hypoxia    Rx / DC Orders ED Discharge Orders    None       Tonia Brooms, MD 05/28/20 3329    Pricilla Loveless, MD 05/28/20 2234

## 2020-05-27 NOTE — ED Notes (Signed)
Informed pt of provider's recommendation for her to go to ED STAT for higher level care/eval.  Patient is being discharged from the Urgent Care and sent to the Emergency Department via POV . Per [place]. [place], [place] patient is in need of higher level of care due to pneumonia, hypoxia, SOB Patient is aware and verbalizes understanding of plan of care.  Vitals:   05/27/20 1505 05/27/20 1507  BP:    Pulse:    Resp:    Temp:    SpO2: 91% (!) 84%

## 2020-05-27 NOTE — ED Notes (Signed)
Attempted to swab pt with PCR, pt pushed this RN hand away as I was inserting the swab. Pt refused to allow this RN to swab.

## 2020-05-27 NOTE — ED Triage Notes (Signed)
Pt arrives to ED with known covid x1 week shortness of breath over the last 1-2 days had covid pneumonia confirmed on xray and urgent care and sent to ER. Pt arrives 85% on room air and placed on 3L [place] and up to 92%.

## 2020-05-27 NOTE — ED Notes (Signed)
Pt noted to have oxygen saturations of 89% while on 3L [place]. Increased to 4L [place] with improvement to 94%. Will cont to monitor.

## 2020-05-27 NOTE — ED Provider Notes (Signed)
[facility]    CSN: [id number] Arrival date & time: 05/27/20  1407      History   Chief Complaint Chief Complaint  Patient presents with  . Shortness of Breath    HPI Melissa Hobbs is a 65 y.o. female.   The history is provided by the patient. No language interpreter was used.  Shortness of Breath Severity:  Severe Onset quality:  Gradual Duration:  8 days Progression:  Worsening Chronicity:  New Relieved by:  Nothing Worsened by:  Nothing Ineffective treatments:  None tried Associated symptoms: no chest pain   Risk factors: no hx of cancer and no tobacco use     Past Medical History:  Diagnosis Date  . Hypertension     There are no problems to display for this patient.   Past Surgical History:  Procedure Laterality Date  . ABDOMINAL HYSTERECTOMY      OB History   No obstetric history on file.      Home Medications    Prior to Admission medications   Medication Sig Start Date End Date Taking? Authorizing Provider  amLODipine (NORVASC) 5 MG tablet Take 5 mg by mouth daily. 12/31/19   [provider]  oxyCODONE-acetaminophen (PERCOCET) 5-325 MG tablet Take 1 tablet by mouth every 4 (four) hours as needed. 09/07/17   Gilda Crease, MD    Family History History reviewed. No pertinent family history.  Social History Social History   Tobacco Use  . Smoking status: Never Smoker  . Smokeless tobacco: Never Used  Vaping Use  . Vaping Use: Never used  Substance Use Topics  . Alcohol use: Never  . Drug use: Never     Allergies   Penicillins   Review of Systems Review of Systems  Respiratory: Positive for shortness of breath.   Cardiovascular: Negative for chest pain.  All other systems reviewed and are negative.    Physical Exam Triage Vital Signs ED Triage Vitals  Enc Vitals Group     BP 05/27/20 1415 120/62     Pulse Rate 05/27/20 1415 90     Resp 05/27/20 1415 (!) 32     Temp 05/27/20 1415 98.4 F (36.9  C)     Temp Source 05/27/20 1415 Oral     SpO2 05/27/20 1415 (!) 86 %     Weight --      Height --      Head Circumference --      Peak Flow --      Pain Score 05/27/20 1425 4     Pain Loc --      Pain Edu? --      Excl. in GC? --    No data found.  Updated Vital Signs BP 120/62 (BP Location: Left Arm)   Pulse 90   Temp 98.4 F (36.9 C) (Oral)   Resp (!) 32   SpO2 (!) 86%   Visual Acuity Right Eye Distance:   Left Eye Distance:   Bilateral Distance:    Right Eye Near:   Left Eye Near:    Bilateral Near:     Physical Exam Vitals and nursing note reviewed.  Constitutional:      Appearance: She is well-developed and well-nourished.  HENT:     Head: Normocephalic.  Eyes:     Extraocular Movements: EOM normal.  Cardiovascular:     Rate and Rhythm: Tachycardia present.  Pulmonary:     Breath sounds: Examination of the right-upper field reveals decreased breath  sounds. Examination of the left-upper field reveals decreased breath sounds. Examination of the right-middle field reveals decreased breath sounds. Examination of the left-middle field reveals decreased breath sounds. Examination of the right-lower field reveals decreased breath sounds. Examination of the left-lower field reveals decreased breath sounds. Decreased breath sounds present.  Abdominal:     General: There is no distension.  Musculoskeletal:        General: Normal range of motion.     Cervical back: Normal range of motion.  Skin:    General: Skin is warm.  Neurological:     General: No focal deficit present.     Mental Status: She is alert and oriented to person, place, and time.  Psychiatric:        Mood and Affect: Mood and affect normal.      UC Treatments / Results  Labs (all labs ordered are listed, but only abnormal results are displayed) Labs Reviewed - No data to display  EKG   Radiology DG Chest 2 View  Result Date: 05/27/2020 CLINICAL DATA:  Shortness of breath and decreased  oxygen saturation. The patient tested positive for COVID-19 9 days ago. EXAM: CHEST - 2 VIEW COMPARISON:  None. FINDINGS: The patient has multifocal bilateral airspace disease with a peripheral predominance. No pneumothorax or pleural effusion. Heart size is normal. No acute or focal bony abnormality. IMPRESSION: Bilateral pneumonia has an appearance compatible with COVID-19 infection. Electronically Signed   By: Drusilla Kanner M.D.   On: 05/27/2020 14:38    Procedures Procedures (including critical care time)  Medications Ordered in UC Medications  albuterol (VENTOLIN HFA) 108 (90 Base) MCG/ACT inhaler 4 puff (4 puffs Inhalation Given 05/27/20 1424)  AeroChamber Plus Flo-Vu Large MISC 1 each (1 each Other Given 05/27/20 1424)  albuterol (VENTOLIN HFA) 108 (90 Base) MCG/ACT inhaler 4 puff (0 puffs Inhalation Duplicate 05/27/20 1437)    Initial Impression / Assessment and Plan / UC Course  I have reviewed the triage vital signs and the nursing notes.  Pertinent labs & imaging results that were available during my care of the patient were reviewed by me and considered in my medical decision making (see chart for details).     MDM:  Pt given albuterol  Pt reports some relief.  Chest xray shows covid pneumonia.   Pt ambulated in room  02 84%.   Pt advised of pneumonia and need to go to ED  Final Clinical Impressions(s) / UC Diagnoses   Final diagnoses:  None   Discharge Instructions   None    ED Prescriptions    None     PDMP not reviewed this encounter.  An After Visit Summary was printed and given to the patient.    Elson Areas, [place] 05/27/20 1513

## 2020-05-28 DIAGNOSIS — R0902 Hypoxemia: Secondary | ICD-10-CM

## 2020-05-28 DIAGNOSIS — J1282 Pneumonia due to coronavirus disease 2019: Secondary | ICD-10-CM

## 2020-05-28 DIAGNOSIS — U071 COVID-19: Secondary | ICD-10-CM | POA: Diagnosis not present

## 2020-05-28 DIAGNOSIS — I10 Essential (primary) hypertension: Secondary | ICD-10-CM

## 2020-05-28 LAB — COMPREHENSIVE METABOLIC PANEL
ALT: 32 U/L (ref 0–44)
AST: 31 U/L (ref 15–41)
Albumin: 2.7 g/dL — ABNORMAL LOW (ref 3.5–5.0)
Alkaline Phosphatase: 66 U/L (ref 38–126)
Anion gap: 11 (ref 5–15)
BUN: 21 mg/dL (ref 8–23)
CO2: 24 mmol/L (ref 22–32)
Calcium: 8.6 mg/dL — ABNORMAL LOW (ref 8.9–10.3)
Chloride: 103 mmol/L (ref 98–111)
Creatinine, Ser: 0.76 mg/dL (ref 0.44–1.00)
GFR, Estimated: >60 mL/min (ref >60)
Glucose, Bld: 150 mg/dL — ABNORMAL HIGH (ref 70–99)
Potassium: 4 mmol/L (ref 3.5–5.1)
Sodium: 138 mmol/L (ref 135–145)
Total Bilirubin: 1.1 mg/dL (ref 0.3–1.2)
Total Protein: 6 g/dL — ABNORMAL LOW (ref 6.5–8.1)

## 2020-05-28 LAB — CBC WITH DIFFERENTIAL/PLATELET
Abs Immature Granulocytes: 0.02 10*3/uL (ref 0.00–0.07)
Basophils Absolute: 0 10*3/uL (ref 0.0–0.1)
Basophils Relative: 0 %
Eosinophils Absolute: 0 10*3/uL (ref 0.0–0.5)
Eosinophils Relative: 0 %
HCT: 36.3 % (ref 36.0–46.0)
Hemoglobin: 12.9 g/dL (ref 12.0–15.0)
Immature Granulocytes: 1 %
Lymphocytes Relative: 18 %
Lymphs Abs: 0.5 10*3/uL — ABNORMAL LOW (ref 0.7–4.0)
MCH: 31.5 pg (ref 26.0–34.0)
MCHC: 35.5 g/dL (ref 30.0–36.0)
MCV: 88.8 fL (ref 80.0–100.0)
Monocytes Absolute: 0.2 10*3/uL (ref 0.1–1.0)
Monocytes Relative: 6 %
Neutro Abs: 2.2 10*3/uL (ref 1.7–7.7)
Neutrophils Relative %: 75 %
Platelets: 218 10*3/uL (ref 150–400)
RBC: 4.09 MIL/uL (ref 3.87–5.11)
RDW: 11.6 % (ref 11.5–15.5)
WBC: 2.9 10*3/uL — ABNORMAL LOW (ref 4.0–10.5)
nRBC: 0 % (ref 0.0–0.2)

## 2020-05-28 LAB — D-DIMER, QUANTITATIVE: D-Dimer, Quant: 1.23 ug/mL-FEU — ABNORMAL HIGH (ref 0.00–0.50)

## 2020-05-28 LAB — C-REACTIVE PROTEIN: CRP: 9.4 mg/dL — ABNORMAL HIGH (ref <1.0)

## 2020-05-28 LAB — SARS CORONAVIRUS 2 (TAT 6-24 HRS): SARS Coronavirus 2: POSITIVE — AB

## 2020-05-28 LAB — HIV ANTIBODY (ROUTINE TESTING W REFLEX): HIV Screen 4th Generation wRfx: NONREACTIVE

## 2020-05-28 MED ORDER — BENZONATATE 100 MG PO CAPS
200.0000 mg | ORAL_CAPSULE | Freq: Three times a day (TID) | ORAL | Status: DC
  Administered 2020-05-28 – 2020-05-30 (×7): 200 mg via ORAL
  Filled 2020-05-28 (×7): qty 2

## 2020-05-28 MED ORDER — METHYLPREDNISOLONE SODIUM SUCC 40 MG IJ SOLR
40.0000 mg | Freq: Two times a day (BID) | INTRAMUSCULAR | Status: DC
  Administered 2020-05-28 – 2020-05-30 (×5): 40 mg via INTRAVENOUS
  Filled 2020-05-28 (×5): qty 1

## 2020-05-28 MED ORDER — ONDANSETRON HCL 4 MG/2ML IJ SOLN: 4.0000 mg | Freq: Four times a day (QID) | INTRAMUSCULAR | Status: DC | PRN

## 2020-05-28 MED ORDER — ALBUTEROL SULFATE HFA 108 (90 BASE) MCG/ACT IN AERS
2.0000 | INHALATION_SPRAY | RESPIRATORY_TRACT | Status: DC | PRN
  Filled 2020-05-28: qty 6.7

## 2020-05-28 MED ORDER — GUAIFENESIN-DM 100-10 MG/5ML PO SYRP: 5.0000 mL | ORAL_SOLUTION | ORAL | Status: DC | PRN

## 2020-05-28 NOTE — Progress Notes (Signed)
PROGRESS NOTE                                                                                                                                                                                                             Patient Demographics:    Melissa Hobbs, is a 65 y.o. female, DOB - Mar 24, 1956, PIR:[id number]  Outpatient Primary MD for the patient is [facility], [facility]   Admit date - 05/27/2020   LOS - 1  Chief Complaint  Patient presents with  . Covid Positive    PNA       Brief Narrative: Patient is a 65 y.o. female with PMHx of HTN-presented with 1 week history of cough/nausea/worsening shortness of breath-she was found to have acute hypoxic respiratory failure due to COVID-19 pneumonia.   COVID-19 vaccinated status: Unvaccinated  Significant Events: 2/18>> Admit to [facility] for hypoxia due to COVID-19 PNA  Significant studies: 2/18>>Chest x-ray: Multifocal pneumonia (personally reviewed)  COVID-19 medications: Steroids: 2/18>> Remdesivir: 2/18>>  Antibiotics: None  Microbiology data: 2/18 >>blood culture: No growth  Procedures: None  Consults: None  DVT prophylaxis: enoxaparin (LOVENOX) injection 40 mg Start: 05/27/20 2200    Subjective:    Melissa Hobbs today feels better-she was on 5 L of oxygen overnight-I titrated her down to 3 L.  She feels better.   Assessment  & Plan :   Acute Hypoxic Resp Failure due to Covid 19 Viral pneumonia: Feels overall better-some improvement overnight-continue steroids/Remdesivir.  If she worsens-she has consented to the use of Actemra.  Note-rationale/risk/benefits of Actemra discussed with patient in detail-she has no history of TB, hepatitis B, diverticulitis-understands risk of bacterial infections-and consents to the use of Actemra if indicated.  Fever: afebrile O2 requirements:  SpO2: 95 % O2 Flow Rate (L/min): 3 L/min FiO2 (%): 100 %    COVID-19 Labs: Recent Labs    05/27/20 1542 05/28/20 0529  DDIMER 1.62* 1.23*  FERRITIN 956*  --   LDH 313*  --   CRP 9.4* 9.4*    No results found for: BNP  Recent Labs  Lab 05/27/20 1542  PROCALCITON <0.10    Lab Results  Component Value Date   SARSCOV2NAA POSITIVE (A) 05/27/2020    Prone/Incentive Spirometry: encouraged patient to lie prone for 3-4 hours at a time for a total of 16 hours a  day, and to encourage incentive spirometry use 3-4/hour.  HTN: BP stable-resume when able.  ABG:    Component Value Date/Time   TCO2 25 09/07/2017 0542    Vent Settings: N/A FiO2 (%):  [100 %] 100 %  Condition -Guarded  Family Communication  :  Paul-[phone] left voicemail on 2/19  Code Status :  Full Code  Diet :  Diet Order            Diet regular Room service appropriate? Yes; Fluid consistency: Thin  Diet effective now                  Disposition Plan  :   Status is: Inpatient  Remains inpatient appropriate because:Inpatient level of care appropriate due to severity of illness   Dispo: The patient is from: Home              Anticipated d/c is to: Home              Anticipated d/c date is: > 3 days              Patient currently is not medically stable to d/c.   Difficult to place patient No   Barriers to discharge: Hypoxia requiring O2 supplementation/complete 5 days of IV Remdesivir  Antimicorbials  :    Anti-infectives (From admission, onward)   Start     Dose/Rate Route Frequency Ordered Stop   05/28/20 1000  remdesivir 100 mg in sodium chloride 0.9 % 100 mL IVPB       "Followed by" Linked Group Details   100 mg 200 mL/hr over 30 Minutes Intravenous Daily 05/27/20 1754 06/01/20 0959   05/27/20 1900  remdesivir 200 mg in sodium chloride 0.9% 250 mL IVPB       "Followed by" Linked Group Details   200 mg 580 mL/hr over 30 Minutes Intravenous Once 05/27/20 1754 05/28/20 6433      Inpatient Medications  Scheduled Meds: .  enoxaparin (LOVENOX) injection  40 mg Subcutaneous Q24H  . methylPREDNISolone (SOLU-MEDROL) injection  40 mg Intravenous Q12H   Continuous Infusions: . remdesivir 100 mg in NS 100 mL 100 mg (05/28/20 0905)   PRN Meds:.acetaminophen, guaiFENesin-dextromethorphan   Time Spent in minutes  25   See all Orders from today for further details   Jeoffrey Massed M.D on 05/28/2020 at 10:19 AM  To page go to [email or web address] - use universal password  [facility] -  Office  [phone]    Objective:   Vitals:   05/27/20 2330 05/28/20 0402 05/28/20 0441 05/28/20 0900  BP: 128/76 127/68 124/65   Pulse: 88 69 76 85  Resp: (!) 26 16 18    Temp:  98.7 F (37.1 C) 99.5 F (37.5 C)   TempSrc:  Oral Oral   SpO2: 91% 93% 90% 95%  Weight:   64.2 kg   Height:   5\' 1"  (1.549 m)     Wt Readings from Last 3 Encounters:  05/28/20 64.2 kg     Intake/Output Summary (Last 24 hours) at 05/28/2020 1019 Last data filed at 05/27/2020 2041 Gross per 24 hour  Intake 250 ml  Output --  Net 250 ml     Physical Exam Gen Exam:Alert awake-not in any distress HEENT:atraumatic, normocephalic Chest: B/L clear to auscultation anteriorly CVS:S1S2 regular Abdomen:soft non tender, non distended Extremities:no edema Neurology: Non focal Skin: no rash   Data Review:    CBC Recent Labs  Lab 05/27/20 1542 05/28/20 0529  WBC 6.4 2.9*  HGB 13.7 12.9  HCT 39.8 36.3  PLT 222 218  MCV 89.4 88.8  MCH 30.8 31.5  MCHC 34.4 35.5  RDW 11.6 11.6  LYMPHSABS 1.0 0.5*  MONOABS 0.4 0.2  EOSABS 0.0 0.0  BASOSABS 0.0 0.0    Chemistries  Recent Labs  Lab 05/27/20 1542 05/28/20 0529  NA 138 138  K 4.1 4.0  CL 99 103  CO2 27 24  GLUCOSE 121* 150*  BUN 23 21  CREATININE 0.96 0.76  CALCIUM 8.8* 8.6*  AST 41 31  ALT 37 32  ALKPHOS 64 66  BILITOT 0.6 1.1   ------------------------------------------------------------------------------------------------------------------ Recent Labs     05/27/20 1542  TRIG 180*    No results found for: HGBA1C ------------------------------------------------------------------------------------------------------------------ No results for input(s): TSH, T4TOTAL, T3FREE, THYROIDAB in the last 72 hours.  Invalid input(s): FREET3 ------------------------------------------------------------------------------------------------------------------ Recent Labs    05/27/20 1542  FERRITIN 956*    Coagulation profile No results for input(s): INR, PROTIME in the last 168 hours.  Recent Labs    05/27/20 1542 05/28/20 0529  DDIMER 1.62* 1.23*    Cardiac Enzymes No results for input(s): CKMB, TROPONINI, MYOGLOBIN in the last 168 hours.  Invalid input(s): CK ------------------------------------------------------------------------------------------------------------------ No results found for: BNP  Micro Results Recent Results (from the past 240 hour(s))  Blood Culture (routine x 2)     Status: None (Preliminary result)   Collection Time: 05/27/20  3:47 PM   Specimen: BLOOD  Result Value Ref Range Status   Specimen Description BLOOD SITE NOT SPECIFIED  Final   Special Requests   Final    BOTTLES DRAWN AEROBIC AND ANAEROBIC Blood Culture results may not be optimal due to an inadequate volume of blood received in culture bottles   Culture   Final    NO GROWTH < 24 HOURS Performed at [facility], [address] [place], [place] [address]    Report Status PENDING  Incomplete  SARS CORONAVIRUS 2 (TAT 6-24 HRS) Nasopharyngeal Nasopharyngeal Swab     Status: Abnormal   Collection Time: 05/27/20  7:09 PM   Specimen: Nasopharyngeal Swab  Result Value Ref Range Status   SARS Coronavirus 2 POSITIVE (A) NEGATIVE Final    Comment: (NOTE) SARS-CoV-2 target nucleic acids are DETECTED.  The SARS-CoV-2 RNA is generally detectable in upper and lower respiratory specimens during the acute phase of infection. Positive results are  indicative of the presence of SARS-CoV-2 RNA. Clinical correlation with patient history and other diagnostic information is  necessary to determine patient infection status. Positive results do not rule out bacterial infection or co-infection with other viruses.  The expected result is Negative.  Fact Sheet for Patients: HairSlick.nohttps://www.fda.gov/media/138098/download  Fact Sheet for Healthcare Providers: quierodirigir.comhttps://www.fda.gov/media/138095/download  This test is not yet approved or cleared by the [place] FDA and  has been authorized for detection and/or diagnosis of SARS-CoV-2 by FDA under an Emergency Use Authorization (EUA). This EUA will remain  in effect (meaning this test can be used) for the duration of the COVID-19 declaration under Section 564(b)(1) of the Act, 21 U. S.C. section 360bbb-3(b)(1), unless the authorization is terminated or revoked sooner.   Performed at [facility], [address] [place], [place] [address]   Blood Culture (routine x 2)     Status: None (Preliminary result)   Collection Time: 05/28/20  5:39 AM   Specimen: BLOOD  Result Value Ref Range Status   Specimen Description BLOOD RIGHT ANTECUBITAL  Final   Special Requests AEROBIC  BOTTLE ONLY Blood Culture adequate volume  Final   Culture   Final    NO GROWTH < 12 HOURS Performed at [facility], [address] [place], [place] [address]    Report Status PENDING  Incomplete    Radiology Reports DG Chest 2 View  Result Date: 05/27/2020 CLINICAL DATA:  Shortness of breath and decreased oxygen saturation. The patient tested positive for COVID-19 9 days ago. EXAM: CHEST - 2 VIEW COMPARISON:  None. FINDINGS: The patient has multifocal bilateral airspace disease with a peripheral predominance. No pneumothorax or pleural effusion. Heart size is normal. No acute or focal bony abnormality. IMPRESSION: Bilateral pneumonia has an appearance compatible with COVID-19 infection. Electronically  Signed   By: Drusilla Kanner M.D.   On: 05/27/2020 14:38

## 2020-05-28 NOTE — Progress Notes (Signed)
Patient received to the unit. Patient is alert and oriented x4. Iv in place. Skin assessment done with another nurse, no skin breakdown noted on examination. Given instructions about call bell and phone. Bed in low position and call bell in reach.

## 2020-05-28 NOTE — ED Notes (Signed)
Pt noted to have her O2 [place] under her chin and O2 sat below 90%. Pt educated on need to wear [place] correctly with prongs inside nostrils. Pt verbalized understanding.

## 2020-05-29 DIAGNOSIS — J1282 Pneumonia due to coronavirus disease 2019: Secondary | ICD-10-CM | POA: Diagnosis not present

## 2020-05-29 DIAGNOSIS — I10 Essential (primary) hypertension: Secondary | ICD-10-CM | POA: Diagnosis not present

## 2020-05-29 DIAGNOSIS — U071 COVID-19: Secondary | ICD-10-CM | POA: Diagnosis not present

## 2020-05-29 DIAGNOSIS — R0902 Hypoxemia: Secondary | ICD-10-CM | POA: Diagnosis not present

## 2020-05-29 LAB — CBC WITH DIFFERENTIAL/PLATELET
Abs Immature Granulocytes: 0.06 10*3/uL (ref 0.00–0.07)
Basophils Absolute: 0 10*3/uL (ref 0.0–0.1)
Basophils Relative: 0 %
Eosinophils Absolute: 0 10*3/uL (ref 0.0–0.5)
Eosinophils Relative: 0 %
HCT: 38.9 % (ref 36.0–46.0)
Hemoglobin: 12.9 g/dL (ref 12.0–15.0)
Immature Granulocytes: 1 %
Lymphocytes Relative: 15 %
Lymphs Abs: 0.9 10*3/uL (ref 0.7–4.0)
MCH: 30 pg (ref 26.0–34.0)
MCHC: 33.2 g/dL (ref 30.0–36.0)
MCV: 90.5 fL (ref 80.0–100.0)
Monocytes Absolute: 0.4 10*3/uL (ref 0.1–1.0)
Monocytes Relative: 6 %
Neutro Abs: 4.4 10*3/uL (ref 1.7–7.7)
Neutrophils Relative %: 78 %
Platelets: 263 10*3/uL (ref 150–400)
RBC: 4.3 MIL/uL (ref 3.87–5.11)
RDW: 11.5 % (ref 11.5–15.5)
WBC: 5.7 10*3/uL (ref 4.0–10.5)
nRBC: 0 % (ref 0.0–0.2)

## 2020-05-29 LAB — COMPREHENSIVE METABOLIC PANEL
ALT: 29 U/L (ref 0–44)
AST: 24 U/L (ref 15–41)
Albumin: 2.6 g/dL — ABNORMAL LOW (ref 3.5–5.0)
Alkaline Phosphatase: 58 U/L (ref 38–126)
Anion gap: 9 (ref 5–15)
BUN: 30 mg/dL — ABNORMAL HIGH (ref 8–23)
CO2: 26 mmol/L (ref 22–32)
Calcium: 8.8 mg/dL — ABNORMAL LOW (ref 8.9–10.3)
Chloride: 104 mmol/L (ref 98–111)
Creatinine, Ser: 0.82 mg/dL (ref 0.44–1.00)
GFR, Estimated: >60 mL/min (ref >60)
Glucose, Bld: 170 mg/dL — ABNORMAL HIGH (ref 70–99)
Potassium: 4.2 mmol/L (ref 3.5–5.1)
Sodium: 139 mmol/L (ref 135–145)
Total Bilirubin: 0.5 mg/dL (ref 0.3–1.2)
Total Protein: 5.9 g/dL — ABNORMAL LOW (ref 6.5–8.1)

## 2020-05-29 LAB — C-REACTIVE PROTEIN: CRP: 5.3 mg/dL — ABNORMAL HIGH (ref <1.0)

## 2020-05-29 LAB — D-DIMER, QUANTITATIVE: D-Dimer, Quant: 0.93 ug/mL-FEU — ABNORMAL HIGH (ref 0.00–0.50)

## 2020-05-29 NOTE — Progress Notes (Signed)
PROGRESS NOTE                                                                                                                                                                                                             Patient Demographics:    Melissa Hobbs, is a 65 y.o. female, DOB - [birth year], [id number]:[id number]  Outpatient Primary MD for the patient is [facility]   Admit date - 05/27/2020   LOS - 2  Chief Complaint  Patient presents with  . Covid Positive    PNA       Brief Narrative: Patient is a 65 y.o. female with PMHx of HTN-presented with 1 week history of cough/nausea/worsening shortness of breath-she was found to have acute hypoxic respiratory failure due to COVID-19 pneumonia.   COVID-19 vaccinated status: Unvaccinated  Significant Events: 2/18>> Admit to [facility] for hypoxia due to COVID-19 PNA  Significant studies: 2/18>>Chest x-ray: Multifocal pneumonia (personally reviewed)  COVID-19 medications: Steroids: 2/18>> Remdesivir: 2/18>>  Antibiotics: None  Microbiology data: 2/18 >>blood culture: No growth  Procedures: None  Consults: None  DVT prophylaxis: enoxaparin (LOVENOX) injection 40 mg Start: 05/27/20 2200    Subjective:   Lying comfortably in bed-thinks she feels better-on 2-2 L of oxygen this morning.   Assessment  & Plan :   Acute Hypoxic Resp Failure due to Covid 19 Viral pneumonia: Has mild hypoxemia-on 2-3 L of oxygen-however she feels much better than how she first presented.  Continue steroid/Remdesivir.  If she worsens-she has consented to the use of Actemra.    Note-rationale/risk/benefits of Actemra discussed with patient in detail-she has no history of TB, hepatitis B, diverticulitis-understands risk of bacterial infections-and consents to the use of Actemra if indicated.  Fever: afebrile O2 requirements:  SpO2: 92 % O2 Flow Rate (L/min): 3  L/min FiO2 (%): 100 %   COVID-19 Labs: Recent Labs    05/27/20 1542 05/28/20 0529 05/29/20 0236  DDIMER 1.62* 1.23* 0.93*  FERRITIN 956*  --   --   LDH 313*  --   --   CRP 9.4* 9.4* 5.3*    No results found for: BNP  Recent Labs  Lab 05/27/20 1542  PROCALCITON <0.10    Lab Results  Component Value Date   SARSCOV2NAA POSITIVE (A) 05/27/2020    Prone/Incentive Spirometry: encouraged patient to  lie prone for 3-4 hours at a time for a total of 16 hours a day, and to encourage incentive spirometry use 3-4/hour.  HTN: BP stable-resume when able.  ABG:    Component Value Date/Time   TCO2 25 09/07/2017 0542    Vent Settings: N/A    Condition -Guarded  Family Communication  :  Paul-Spouse-[phone] updated on 2/20  Code Status :  Full Code  Diet :  Diet Order            Diet regular Room service appropriate? Yes; Fluid consistency: Thin  Diet effective now                  Disposition Plan  :   Status is: Inpatient  Remains inpatient appropriate because:Inpatient level of care appropriate due to severity of illness   Dispo: The patient is from: Home              Anticipated d/c is to: Home              Anticipated d/c date is: > 3 days              Patient currently is not medically stable to d/c.   Difficult to place patient No   Barriers to discharge: Hypoxia requiring O2 supplementation/complete 5 days of IV Remdesivir  Antimicorbials  :    Anti-infectives (From admission, onward)   Start     Dose/Rate Route Frequency Ordered Stop   05/28/20 1000  remdesivir 100 mg in sodium chloride 0.9 % 100 mL IVPB       "Followed by" Linked Group Details   100 mg 200 mL/hr over 30 Minutes Intravenous Daily 05/27/20 1754 06/01/20 0959   05/27/20 1900  remdesivir 200 mg in sodium chloride 0.9% 250 mL IVPB       "Followed by" Linked Group Details   200 mg 580 mL/hr over 30 Minutes Intravenous Once 05/27/20 1754 05/28/20 1610      Inpatient  Medications  Scheduled Meds: . benzonatate  200 mg Oral TID  . enoxaparin (LOVENOX) injection  40 mg Subcutaneous Q24H  . methylPREDNISolone (SOLU-MEDROL) injection  40 mg Intravenous Q12H   Continuous Infusions: . remdesivir 100 mg in NS 100 mL 100 mg (05/29/20 0823)   PRN Meds:.acetaminophen, albuterol, guaiFENesin-dextromethorphan, ondansetron (ZOFRAN) IV   Time Spent in minutes  25   See all Orders from today for further details   Jeoffrey Massed M.D on 05/29/2020 at 1:45 PM  To page go to [email or web address] - use universal password  [facility] -  Office  [phone]    Objective:   Vitals:   05/28/20 1424 05/28/20 2024 05/28/20 2143 05/29/20 0538  BP: 110/64 (!) 110/58  115/66  Pulse: 78 75  (!) 57  Resp: 16 16  16   Temp: 98.5 F (36.9 C) 98.7 F (37.1 C)  98.8 F (37.1 C)  TempSrc: Oral Oral  Oral  SpO2: 90% 90% 95% 92%  Weight:    63.6 kg  Height:        Wt Readings from Last 3 Encounters:  05/29/20 63.6 kg     Intake/Output Summary (Last 24 hours) at 05/29/2020 1345 Last data filed at 05/29/2020 0900 Gross per 24 hour  Intake 640 ml  Output --  Net 640 ml     Physical Exam Gen Exam:Alert awake-not in any distress HEENT:atraumatic, normocephalic Chest: B/L clear to auscultation anteriorly CVS:S1S2 regular Abdomen:soft non tender, non distended Extremities:no edema Neurology: Non focal Skin:  no rash   Data Review:    CBC Recent Labs  Lab 05/27/20 1542 05/28/20 0529 05/29/20 0236  WBC 6.4 2.9* 5.7  HGB 13.7 12.9 12.9  HCT 39.8 36.3 38.9  PLT 222 218 263  MCV 89.4 88.8 90.5  MCH 30.8 31.5 30.0  MCHC 34.4 35.5 33.2  RDW 11.6 11.6 11.5  LYMPHSABS 1.0 0.5* 0.9  MONOABS 0.4 0.2 0.4  EOSABS 0.0 0.0 0.0  BASOSABS 0.0 0.0 0.0    Chemistries  Recent Labs  Lab 05/27/20 1542 05/28/20 0529 05/29/20 0236  NA 138 138 139  K 4.1 4.0 4.2  CL 99 103 104  CO2 27 24 26   GLUCOSE 121* 150* 170*  BUN 23 21 30*  CREATININE 0.96 0.76  0.82  CALCIUM 8.8* 8.6* 8.8*  AST 41 31 24  ALT 37 32 29  ALKPHOS 64 66 58  BILITOT 0.6 1.1 0.5   ------------------------------------------------------------------------------------------------------------------ Recent Labs    05/27/20 1542  TRIG 180*    No results found for: HGBA1C ------------------------------------------------------------------------------------------------------------------ No results for input(s): TSH, T4TOTAL, T3FREE, THYROIDAB in the last 72 hours.  Invalid input(s): FREET3 ------------------------------------------------------------------------------------------------------------------ Recent Labs    05/27/20 1542  FERRITIN 956*    Coagulation profile No results for input(s): INR, PROTIME in the last 168 hours.  Recent Labs    05/28/20 0529 05/29/20 0236  DDIMER 1.23* 0.93*    Cardiac Enzymes No results for input(s): CKMB, TROPONINI, MYOGLOBIN in the last 168 hours.  Invalid input(s): CK ------------------------------------------------------------------------------------------------------------------ No results found for: BNP  Micro Results Recent Results (from the past 240 hour(s))  Blood Culture (routine x 2)     Status: None (Preliminary result)   Collection Time: 05/27/20  3:47 PM   Specimen: BLOOD  Result Value Ref Range Status   Specimen Description BLOOD SITE NOT SPECIFIED  Final   Special Requests   Final    BOTTLES DRAWN AEROBIC AND ANAEROBIC Blood Culture results may not be optimal due to an inadequate volume of blood received in culture bottles   Culture   Final    NO GROWTH 2 DAYS Performed at [facility], [address] [place], [place] [address]    Report Status PENDING  Incomplete  SARS CORONAVIRUS 2 (TAT 6-24 HRS) Nasopharyngeal Nasopharyngeal Swab     Status: Abnormal   Collection Time: 05/27/20  7:09 PM   Specimen: Nasopharyngeal Swab  Result Value Ref Range Status   SARS Coronavirus 2 POSITIVE (A)  NEGATIVE Final    Comment: (NOTE) SARS-CoV-2 target nucleic acids are DETECTED.  The SARS-CoV-2 RNA is generally detectable in upper and lower respiratory specimens during the acute phase of infection. Positive results are indicative of the presence of SARS-CoV-2 RNA. Clinical correlation with patient history and other diagnostic information is  necessary to determine patient infection status. Positive results do not rule out bacterial infection or co-infection with other viruses.  The expected result is Negative.  Fact Sheet for Patients: HairSlick.nohttps://www.fda.gov/media/138098/download  Fact Sheet for Healthcare Providers: quierodirigir.comhttps://www.fda.gov/media/138095/download  This test is not yet approved or cleared by the [place] FDA and  has been authorized for detection and/or diagnosis of SARS-CoV-2 by FDA under an Emergency Use Authorization (EUA). This EUA will remain  in effect (meaning this test can be used) for the duration of the COVID-19 declaration under Section 564(b)(1) of the Act, 21 U. S.C. section 360bbb-3(b)(1), unless the authorization is terminated or revoked sooner.   Performed at [facility], [address]  [place], [place] [address]   Blood Culture (routine x 2)     Status: None (Preliminary result)   Collection Time: 05/28/20  5:39 AM   Specimen: BLOOD  Result Value Ref Range Status   Specimen Description BLOOD RIGHT ANTECUBITAL  Final   Special Requests AEROBIC BOTTLE ONLY Blood Culture adequate volume  Final   Culture   Final    NO GROWTH 1 DAY Performed at [facility], [address] [place], [place] [address]    Report Status PENDING  Incomplete    Radiology Reports DG Chest 2 View  Result Date: 05/27/2020 CLINICAL DATA:  Shortness of breath and decreased oxygen saturation. The patient tested positive for COVID-19 9 days ago. EXAM: CHEST - 2 VIEW COMPARISON:  None. FINDINGS: The patient has multifocal bilateral airspace disease with a  peripheral predominance. No pneumothorax or pleural effusion. Heart size is normal. No acute or focal bony abnormality. IMPRESSION: Bilateral pneumonia has an appearance compatible with COVID-19 infection. Electronically Signed   By: Drusilla Kanner M.D.   On: 05/27/2020 14:38

## 2020-05-30 DIAGNOSIS — J1282 Pneumonia due to coronavirus disease 2019: Secondary | ICD-10-CM | POA: Diagnosis not present

## 2020-05-30 DIAGNOSIS — R0902 Hypoxemia: Secondary | ICD-10-CM | POA: Diagnosis not present

## 2020-05-30 DIAGNOSIS — U071 COVID-19: Secondary | ICD-10-CM | POA: Diagnosis not present

## 2020-05-30 LAB — CBC WITH DIFFERENTIAL/PLATELET
Abs Immature Granulocytes: 0.1 10*3/uL — ABNORMAL HIGH (ref 0.00–0.07)
Basophils Absolute: 0 10*3/uL (ref 0.0–0.1)
Basophils Relative: 0 %
Eosinophils Absolute: 0 10*3/uL (ref 0.0–0.5)
Eosinophils Relative: 0 %
HCT: 38.3 % (ref 36.0–46.0)
Hemoglobin: 12.9 g/dL (ref 12.0–15.0)
Immature Granulocytes: 1 %
Lymphocytes Relative: 10 %
Lymphs Abs: 0.8 10*3/uL (ref 0.7–4.0)
MCH: 30.3 pg (ref 26.0–34.0)
MCHC: 33.7 g/dL (ref 30.0–36.0)
MCV: 89.9 fL (ref 80.0–100.0)
Monocytes Absolute: 0.5 10*3/uL (ref 0.1–1.0)
Monocytes Relative: 6 %
Neutro Abs: 6.7 10*3/uL (ref 1.7–7.7)
Neutrophils Relative %: 83 %
Platelets: 281 10*3/uL (ref 150–400)
RBC: 4.26 MIL/uL (ref 3.87–5.11)
RDW: 11.3 % — ABNORMAL LOW (ref 11.5–15.5)
WBC: 8.1 10*3/uL (ref 4.0–10.5)
nRBC: 0 % (ref 0.0–0.2)

## 2020-05-30 LAB — COMPREHENSIVE METABOLIC PANEL
ALT: 28 U/L (ref 0–44)
AST: 21 U/L (ref 15–41)
Albumin: 2.6 g/dL — ABNORMAL LOW (ref 3.5–5.0)
Alkaline Phosphatase: 55 U/L (ref 38–126)
Anion gap: 9 (ref 5–15)
BUN: 30 mg/dL — ABNORMAL HIGH (ref 8–23)
CO2: 27 mmol/L (ref 22–32)
Calcium: 8.5 mg/dL — ABNORMAL LOW (ref 8.9–10.3)
Chloride: 105 mmol/L (ref 98–111)
Creatinine, Ser: 0.82 mg/dL (ref 0.44–1.00)
GFR, Estimated: >60 mL/min (ref >60)
Glucose, Bld: 176 mg/dL — ABNORMAL HIGH (ref 70–99)
Potassium: 4 mmol/L (ref 3.5–5.1)
Sodium: 141 mmol/L (ref 135–145)
Total Bilirubin: 0.4 mg/dL (ref 0.3–1.2)
Total Protein: 5.5 g/dL — ABNORMAL LOW (ref 6.5–8.1)

## 2020-05-30 LAB — D-DIMER, QUANTITATIVE: D-Dimer, Quant: 0.7 ug/mL-FEU — ABNORMAL HIGH (ref 0.00–0.50)

## 2020-05-30 LAB — C-REACTIVE PROTEIN: CRP: 2 mg/dL — ABNORMAL HIGH (ref <1.0)

## 2020-05-30 MED ORDER — PREDNISONE 10 MG PO TABS
ORAL_TABLET | ORAL | 0 refills | Status: DC
Start: 2020-05-30 — End: 2022-05-21

## 2020-05-30 MED ORDER — ALBUTEROL SULFATE HFA 108 (90 BASE) MCG/ACT IN AERS: 2.0000 | INHALATION_SPRAY | RESPIRATORY_TRACT | 0 refills | Status: DC | PRN

## 2020-05-30 MED ORDER — BENZONATATE 200 MG PO CAPS: 200.0000 mg | ORAL_CAPSULE | Freq: Three times a day (TID) | ORAL | 0 refills | Status: DC

## 2020-05-30 NOTE — Progress Notes (Signed)
Patient scheduled for outpatient Remdesivir infusion at 1:30pm on Tuesday 05/31/20 at [facility]. Please inform the patient to park at [address] [place], [place], as staff will be escorting the patient through the east entrance of the hospital. Appointments take approximately 45 minutes.    There is a wave flag banner located near the entrance on [address]. Turn into this entrance and immediately turn left or right and park in 1 of the 10 designated Covid Infusion Parking spots. There is a phone number on the sign, please call and let the staff know what spot you are in and we will come out and get you. For questions call [phone].  Thanks.

## 2020-05-30 NOTE — Discharge Summary (Signed)
PATIENT DETAILS Name: Melissa Hobbs Age: 65 y.o. Sex: female Date of Birth: July 28, 1955 MRN: [id number]. Admitting Physician: Pieter Partridge, MD PCP:Pa, [facility]  Admit Date: 05/27/2020 Discharge date: 05/30/2020  Recommendations for Outpatient Follow-up:  1. Follow up with PCP in 1-2 weeks 2. Please obtain CMP/CBC in one week 3. Repeat Chest Xray in 4-6 week   Admitted From:  Home  Disposition: Home   Home Health: Melissa  Equipment/Devices: None  Discharge Condition: Stable  CODE STATUS: FULL CODE  Diet recommendation:  Diet Order            Diet - low sodium heart healthy           Diet regular Room service appropriate? Yes; Fluid consistency: Thin  Diet effective now                  Brief Narrative: Patient is a 65 y.o. female with PMHx of HTN-presented with 1 week history of cough/nausea/worsening shortness of breath-she was found to have acute hypoxic respiratory failure due to COVID-19 pneumonia.   COVID-19 vaccinated status: Unvaccinated  Significant Events: 2/18>> Admit to [facility] for hypoxia due to COVID-19 PNA  Significant studies: 2/18>>Chest x-ray: Multifocal pneumonia (personally reviewed)  COVID-19 medications: Steroids: 2/18>> Remdesivir: 2/18>>  Antibiotics: None  Microbiology data: 2/18 >>blood culture: Melissa growth  Procedures: None  Consults: None  Brief Hospital Course: Acute Hypoxic Resp Failure due to Covid 19 Viral pneumonia:  Significant improvement after starting steroid/Remdesivir-on room air this morning with O2 saturation in the high 90s.  Inflammatory markers as trended down as well.  She feels much better and is anxious to go home.  She will complete her fifth dose of Remdesivir at the infusion center, she will be on tapering steroids for a few more days.  She was encouraged to get vaccinated approximately a month from now.  PCP to repeat two-view chest x-ray in 4 to 6 weeks-and  reassess at next follow-up.  COVID-19 Labs:  Recent Labs    05/27/20 1542 05/28/20 0529 05/29/20 0236 05/30/20 0031  DDIMER 1.62* 1.23* 0.93* 0.70*  FERRITIN 956*  --   --   --   LDH 313*  --   --   --   CRP 9.4* 9.4* 5.3* 2.0*    Lab Results  Component Value Date   SARSCOV2NAA POSITIVE (A) 05/27/2020    HTN: BP stable-resume on discharge.  Discharge Diagnoses:  Active Problems:   Pneumonia due to COVID-19 virus   Discharge Instructions:    Person Under Monitoring Name: Melissa Hobbs  Location: [address] [place] [address]   Infection Prevention Recommendations for Individuals Confirmed to have, or Being Evaluated for, 2019 Novel Coronavirus (COVID-19) Infection Who Receive Care at Home  Individuals who are confirmed to have, or are being evaluated for, COVID-19 should follow the prevention steps below until a healthcare provider or local or state health department says they can return to normal activities.  Stay home except to get medical care You should restrict activities outside your home, except for getting medical care. Do not go to work, school, or public areas, and do not use public transportation or taxis.  Call ahead before visiting your doctor Before your medical appointment, call the healthcare provider and tell them that you have, or are being evaluated for, COVID-19 infection. This will help the healthcare provider's office take steps to keep other people from getting infected. Ask your healthcare provider to  call the local or state health department.  Monitor your symptoms Seek prompt medical attention if your illness is worsening (e.g., difficulty breathing). Before going to your medical appointment, call the healthcare provider and tell them that you have, or are being evaluated for, COVID-19 infection. Ask your healthcare provider to call the local or state health department.  Wear a facemask You should wear a facemask that  covers your nose and mouth when you are in the same room with other people and when you visit a healthcare provider. People who live with or visit you should also wear a facemask while they are in the same room with you.  Separate yourself from other people in your home As much as possible, you should stay in a different room from other people in your home. Also, you should use a separate bathroom, if available.  Avoid sharing household items You should not share dishes, drinking glasses, cups, eating utensils, towels, bedding, or other items with other people in your home. After using these items, you should wash them thoroughly with soap and water.  Cover your coughs and sneezes Cover your mouth and nose with a tissue when you cough or sneeze, or you can cough or sneeze into your sleeve. Throw used tissues in a lined trash can, and immediately wash your hands with soap and water for at least 20 seconds or use an alcohol-based hand rub.  Wash your [employer] your hands often and thoroughly with soap and water for at least 20 seconds. You can use an alcohol-based hand sanitizer if soap and water are not available and if your hands are not visibly dirty. Avoid touching your eyes, nose, and mouth with unwashed hands.   Prevention Steps for Caregivers and Household Members of Individuals Confirmed to have, or Being Evaluated for, COVID-19 Infection Being Cared for in the Home  If you live with, or provide care at home for, a person confirmed to have, or being evaluated for, COVID-19 infection please follow these guidelines to prevent infection:  Follow healthcare provider's instructions Make sure that you understand and can help the patient follow any healthcare provider instructions for all care.  Provide for the patient's basic needs You should help the patient with basic needs in the home and provide support for getting groceries, prescriptions, and other personal needs.  Monitor  the patient's symptoms If they are getting sicker, call his or her medical provider and tell them that the patient has, or is being evaluated for, COVID-19 infection. This will help the healthcare provider's office take steps to keep other people from getting infected. Ask the healthcare provider to call the local or state health department.  Limit the number of people who have contact with the patient  If possible, have only one caregiver for the patient.  Other household members should stay in another home or place of residence. If this is not possible, they should stay  in another room, or be separated from the patient as much as possible. Use a separate bathroom, if available.  Restrict visitors who do not have an essential need to be in the home.  Keep older adults, very young children, and other sick people away from the patient Keep older adults, very young children, and those who have compromised immune systems or chronic health conditions away from the patient. This includes people with chronic heart, lung, or kidney conditions, diabetes, and cancer.  Ensure good ventilation Make sure that shared spaces in the home have good  air flow, such as from an air conditioner or an opened window, weather permitting.  Wash your hands often  Wash your hands often and thoroughly with soap and water for at least 20 seconds. You can use an alcohol based hand sanitizer if soap and water are not available and if your hands are not visibly dirty.  Avoid touching your eyes, nose, and mouth with unwashed hands.  Use disposable paper towels to dry your hands. If not available, use dedicated cloth towels and replace them when they become wet.  Wear a facemask and gloves  Wear a disposable facemask at all times in the room and gloves when you touch or have contact with the patient's blood, body fluids, and/or secretions or excretions, such as sweat, saliva, sputum, nasal mucus, vomit, urine, or  feces.  Ensure the mask fits over your nose and mouth tightly, and do not touch it during use.  Throw out disposable facemasks and gloves after using them. Do not reuse.  Wash your hands immediately after removing your facemask and gloves.  If your personal clothing becomes contaminated, carefully remove clothing and launder. Wash your hands after handling contaminated clothing.  Place all used disposable facemasks, gloves, and other waste in a lined container before disposing them with other household waste.  Remove gloves and wash your hands immediately after handling these items.  Do not share dishes, glasses, or other household items with the patient  Avoid sharing household items. You should not share dishes, drinking glasses, cups, eating utensils, towels, bedding, or other items with a patient who is confirmed to have, or being evaluated for, COVID-19 infection.  After the person uses these items, you should wash them thoroughly with soap and water.  Wash laundry thoroughly  Immediately remove and wash clothes or bedding that have blood, body fluids, and/or secretions or excretions, such as sweat, saliva, sputum, nasal mucus, vomit, urine, or feces, on them.  Wear gloves when handling laundry from the patient.  Read and follow directions on labels of laundry or clothing items and detergent. In general, wash and dry with the warmest temperatures recommended on the label.  Clean all areas the individual has used often  Clean all touchable surfaces, such as counters, tabletops, doorknobs, bathroom fixtures, toilets, phones, keyboards, tablets, and bedside tables, every day. Also, clean any surfaces that may have blood, body fluids, and/or secretions or excretions on them.  Wear gloves when cleaning surfaces the patient has come in contact with.  Use a diluted bleach solution (e.g., dilute bleach with 1 part bleach and 10 parts water) or a household disinfectant with a label that  says EPA-registered for coronaviruses. To make a bleach solution at home, add 1 tablespoon of bleach to 1 quart (4 cups) of water. For a larger supply, add  cup of bleach to 1 gallon (16 cups) of water.  Read labels of cleaning products and follow recommendations provided on product labels. Labels contain instructions for safe and effective use of the cleaning product including precautions you should take when applying the product, such as wearing gloves or eye protection and making sure you have good ventilation during use of the product.  Remove gloves and wash hands immediately after cleaning.  Monitor yourself for signs and symptoms of illness Caregivers and household members are considered close contacts, should monitor their health, and will be asked to limit movement outside of the home to the extent possible. Follow the monitoring steps for close contacts listed on the symptom monitoring  form.   ? If you have additional questions, contact your local health department or call the epidemiologist on call at [phone] (available 24/7). ? This guidance is subject to change. For the most up-to-date guidance from CDC, please refer to their website: TripMetro.hu    Activity:  As tolerated    Discharge Instructions    Call MD for:  difficulty breathing, headache or visual disturbances   Complete by: As directed    Diet - low sodium heart healthy   Complete by: As directed    Discharge instructions   Complete by: As directed    1.)  21 days of isolation from 2/18  2.)  If you develop worsening shortness of breath-please seek immediate medical attention  3.)  Please ask your primary care practitioner to repeat a two-view chest x-ray in 4 to 6 weeks  4.)You have been scheduled for outpatient Remdesivir infusion at 1:30pm on Tuesday 05/31/20 at [facility]. Please park at[address], [place],  staff will  escort you through the east entrance of the hospital.Appointments take approximately 45 minutes.   There is a wave flag banner located near the entrance on [address]. Turn into this entranceand immediatelyturn left or right and park in 1 of the 10 designated Covid Infusion Parking spots. There is a phone number on the sign, please call and let the staff know what spot you are in and we will come out and get you. For questions call [phone].   Follow with Primary MD  Pa, [facility] in 1-2 weeks  Please get a complete blood count and chemistry panel checked by your Primary MD at your next visit, and again as instructed by your Primary MD.  Get Medicines reviewed and adjusted: Please take all your medications with you for your next visit with your Primary MD  Laboratory/radiological data: Please request your Primary MD to go over all hospital tests and procedure/radiological results at the follow up, please ask your Primary MD to get all Hospital records sent to his/her office.  In some cases, they will be blood work, cultures and biopsy results pending at the time of your discharge. Please request that your primary care M.D. follows up on these results.  Also Note the following: If you experience worsening of your admission symptoms, develop shortness of breath, life threatening emergency, suicidal or homicidal thoughts you must seek medical attention immediately by calling 911 or calling your MD immediately  if symptoms less severe.  You must read complete instructions/literature along with all the possible adverse reactions/side effects for all the Medicines you take and that have been prescribed to you. Take any new Medicines after you have completely understood and accpet all the possible adverse reactions/side effects.   Do not drive when taking Pain medications or sleeping medications (Benzodaizepines)  Do not take more than prescribed Pain, Sleep and Anxiety  Medications. It is not advisable to combine anxiety,sleep and pain medications without talking with your primary care practitioner  Special Instructions: If you have smoked or chewed Tobacco  in the last 2 yrs please stop smoking, stop any regular Alcohol  and or any Recreational drug use.  Wear Seat belts while driving.  Please note: You were cared for by a hospitalist during your hospital stay. Once you are discharged, your primary care physician will handle any further medical issues. Please note that Melissa REFILLS for any discharge medications will be authorized once you are discharged, as it is imperative that  you return to your primary care physician (or establish a relationship with a primary care physician if you do not have one) for your post hospital discharge needs so that they can reassess your need for medications and monitor your lab values.   Increase activity slowly   Complete by: As directed      Allergies as of 05/30/2020      Reactions   Penicillins Hives, Swelling      Medication List    STOP taking these medications   oxyCODONE-acetaminophen 5-325 MG tablet Commonly known as: Percocet     TAKE these medications   albuterol 108 (90 Base) MCG/ACT inhaler Commonly known as: VENTOLIN HFA Inhale 2 puffs into the lungs every 4 (four) hours as needed for shortness of breath.   amLODipine 5 MG tablet Commonly known as: NORVASC Take 5 mg by mouth daily.   benzonatate 200 MG capsule Commonly known as: TESSALON Take 1 capsule (200 mg total) by mouth 3 (three) times daily.   calcium carbonate 500 MG chewable tablet Commonly known as: TUMS - dosed in mg elemental calcium Chew 1 tablet by mouth daily.   predniSONE 10 MG tablet Commonly known as: DELTASONE Take 40 mg daily for 1 day, 30 mg daily for 1 day, 20 mg daily for 1 days,10 mg daily for 1 day, then stop   Vitamin D3 25 MCG (1000 UT) Caps Take 1,000 Units by mouth daily.       Follow-up Information    Pa,  [facility]. Schedule an appointment as soon as possible for a visit in 1 week(s).   Specialty: Family Medicine Contact information: [address] [place] [address] [phone]              Allergies  Allergen Reactions  . Penicillins Hives and Swelling    Other Procedures/Studies: DG Chest 2 View  Result Date: 05/27/2020 CLINICAL DATA:  Shortness of breath and decreased oxygen saturation. The patient tested positive for COVID-19 9 days ago. EXAM: CHEST - 2 VIEW COMPARISON:  None. FINDINGS: The patient has multifocal bilateral airspace disease with a peripheral predominance. Melissa pneumothorax or pleural effusion. Heart size is normal. Melissa acute or focal bony abnormality. IMPRESSION: Bilateral pneumonia has an appearance compatible with COVID-19 infection. Electronically Signed   By: Drusilla Kanner M.D.   On: 05/27/2020 14:38     TODAY-DAY OF DISCHARGE:  Subjective:   Melissa Hobbs today has Melissa headache,Melissa chest abdominal pain,Melissa new weakness tingling or numbness, feels much better wants to go home today.   Objective:   Blood pressure (!) 114/55, pulse 62, temperature 98.6 F (37 C), temperature source Oral, resp. rate 18, height 5\' 1"  (1.549 m), weight 65 kg, SpO2 93 %.  Intake/Output Summary (Last 24 hours) at 05/30/2020 0943 Last data filed at 05/29/2020 2200 Gross per 24 hour  Intake 180 ml  Output -  Net 180 ml   Filed Weights   05/28/20 0441 05/29/20 0538 05/30/20 0404  Weight: 64.2 kg 63.6 kg 65 kg    Exam: Awake Alert, Oriented *3, Melissa new F.N deficits, Normal affect Melissa Hobbs,Melissa Hobbs,Melissa Hobbs, Melissa cervical lymphadenopathy appriciated.  Symmetrical Chest wall movement, Good air movement bilaterally, CTAB RRR,Melissa Gallops,Rubs or new Murmurs, Melissa Parasternal Heave +ve B.Sounds, Abd Soft, Non tender, Melissa organomegaly appriciated, Melissa rebound -guarding or rigidity. Melissa Cyanosis, Clubbing or edema, Melissa new Rash or  bruise   PERTINENT RADIOLOGIC STUDIES: DG Chest 2 View  Result Date:  05/27/2020 CLINICAL DATA:  Shortness of breath and decreased oxygen saturation. The patient tested positive for COVID-19 9 days ago. EXAM: CHEST - 2 VIEW COMPARISON:  None. FINDINGS: The patient has multifocal bilateral airspace disease with a peripheral predominance. Melissa pneumothorax or pleural effusion. Heart size is normal. Melissa acute or focal bony abnormality. IMPRESSION: Bilateral pneumonia has an appearance compatible with COVID-19 infection. Electronically Signed   By: Drusilla Kanner M.D.   On: 05/27/2020 14:38     PERTINENT LAB RESULTS: CBC: Recent Labs    05/29/20 0236 05/30/20 0031  WBC 5.7 8.1  HGB 12.9 12.9  HCT 38.9 38.3  PLT 263 281   CMET CMP     Component Value Date/Time   NA 141 05/30/2020 0031   K 4.0 05/30/2020 0031   CL 105 05/30/2020 0031   CO2 27 05/30/2020 0031   GLUCOSE 176 (H) 05/30/2020 0031   BUN 30 (H) 05/30/2020 0031   CREATININE 0.82 05/30/2020 0031   CALCIUM 8.5 (L) 05/30/2020 0031   PROT 5.5 (L) 05/30/2020 0031   ALBUMIN 2.6 (L) 05/30/2020 0031   AST 21 05/30/2020 0031   ALT 28 05/30/2020 0031   ALKPHOS 55 05/30/2020 0031   BILITOT 0.4 05/30/2020 0031   GFRNONAA >60 05/30/2020 0031    GFR Estimated Creatinine Clearance: 58.3 mL/min (by C-G formula based on SCr of 0.82 mg/dL). Melissa results for input(s): LIPASE, AMYLASE in the last 72 hours. Melissa results for input(s): CKTOTAL, CKMB, CKMBINDEX, TROPONINI in the last 72 hours. Invalid input(s): POCBNP Recent Labs    05/29/20 0236 05/30/20 0031  DDIMER 0.93* 0.70*   Melissa results for input(s): HGBA1C in the last 72 hours. Recent Labs    05/27/20 1542  TRIG 180*   Melissa results for input(s): TSH, T4TOTAL, T3FREE, THYROIDAB in the last 72 hours.  Invalid input(s): FREET3 Recent Labs    05/27/20 1542  FERRITIN 956*   Coags: Melissa results for input(s): INR in the last 72 hours.  Invalid input(s):  PT Microbiology: Recent Results (from the past 240 hour(s))  Blood Culture (routine x 2)     Status: None (Preliminary result)   Collection Time: 05/27/20  3:47 PM   Specimen: BLOOD  Result Value Ref Range Status   Specimen Description BLOOD SITE NOT SPECIFIED  Final   Special Requests   Final    BOTTLES DRAWN AEROBIC AND ANAEROBIC Blood Culture results may not be optimal due to an inadequate volume of blood received in culture bottles   Culture   Final    Melissa GROWTH 3 DAYS Performed at [facility], [address], [place], [place] [address]    Report Status PENDING  Incomplete  SARS CORONAVIRUS 2 (TAT 6-24 HRS) Nasopharyngeal Nasopharyngeal Swab     Status: Abnormal   Collection Time: 05/27/20  7:09 PM   Specimen: Nasopharyngeal Swab  Result Value Ref Range Status   SARS Coronavirus 2 POSITIVE (A) NEGATIVE Final    Comment: (NOTE) SARS-CoV-2 target nucleic acids are DETECTED.  The SARS-CoV-2 RNA is generally detectable in upper and lower respiratory specimens during the acute phase of infection. Positive results are indicative of the presence of SARS-CoV-2 RNA. Clinical correlation with patient history and other diagnostic information is  necessary to determine patient infection status. Positive results do not rule out bacterial infection or co-infection with other viruses.  The expected result is Negative.  Fact Sheet for Patients: HairSlick.Melissa  Fact Sheet for Healthcare Providers: quierodirigir.com  This test is not  yet approved or cleared by the [place] FDA and  has been authorized for detection and/or diagnosis of SARS-CoV-2 by FDA under an Emergency Use Authorization (EUA). This EUA will remain  in effect (meaning this test can be used) for the duration of the COVID-19 declaration under Section 564(b)(1) of the Act, 21 U. S.C. section 360bbb-3(b)(1), unless the authorization is terminated or revoked  sooner.   Performed at [facility], [address] [place], [place] [address]   Blood Culture (routine x 2)     Status: None (Preliminary result)   Collection Time: 05/28/20  5:39 AM   Specimen: BLOOD  Result Value Ref Range Status   Specimen Description BLOOD RIGHT ANTECUBITAL  Final   Special Requests AEROBIC BOTTLE ONLY Blood Culture adequate volume  Final   Culture   Final    Melissa GROWTH 2 DAYS Performed at [facility], [address] [place], [place] [address]    Report Status PENDING  Incomplete    FURTHER DISCHARGE INSTRUCTIONS:  Get Medicines reviewed and adjusted: Please take all your medications with you for your next visit with your Primary MD  Laboratory/radiological data: Please request your Primary MD to go over all hospital tests and procedure/radiological results at the follow up, please ask your Primary MD to get all Hospital records sent to his/her office.  In some cases, they will be blood work, cultures and biopsy results pending at the time of your discharge. Please request that your primary care M.D. goes through all the records of your hospital data and follows up on these results.  Also Note the following: If you experience worsening of your admission symptoms, develop shortness of breath, life threatening emergency, suicidal or homicidal thoughts you must seek medical attention immediately by calling 911 or calling your MD immediately  if symptoms less severe.  You must read complete instructions/literature along with all the possible adverse reactions/side effects for all the Medicines you take and that have been prescribed to you. Take any new Medicines after you have completely understood and accpet all the possible adverse reactions/side effects.   Do not drive when taking Pain medications or sleeping medications (Benzodaizepines)  Do not take more than prescribed Pain, Sleep and Anxiety Medications. It is not advisable to combine  anxiety,sleep and pain medications without talking with your primary care practitioner  Special Instructions: If you have smoked or chewed Tobacco  in the last 2 yrs please stop smoking, stop any regular Alcohol  and or any Recreational drug use.  Wear Seat belts while driving.  Please note: You were cared for by a hospitalist during your hospital stay. Once you are discharged, your primary care physician will handle any further medical issues. Please note that Melissa REFILLS for any discharge medications will be authorized once you are discharged, as it is imperative that you return to your primary care physician (or establish a relationship with a primary care physician if you do not have one) for your post hospital discharge needs so that they can reassess your need for medications and monitor your lab values.  Total Time spent coordinating discharge including counseling, education and face to face time equals 35 minutes.  SignedJeoffrey Massed: Latorria Zeoli 05/30/2020 9:43 AM

## 2020-05-30 NOTE — Discharge Instructions (Signed)
Patient scheduled for outpatient Remdesivir infusion at 1:30pm on Tuesday 05/31/20 at [facility]. Please inform the patient to park at [address] [place], [place], as staff will be escorting the patient through the east entrance of the hospital. Appointments take approximately 45 minutes.    There is a wave flag banner located near the entrance on [address]. Turn into this entrance and immediately turn left or right and park in 1 of the 10 designated Covid Infusion Parking spots. There is a phone number on the sign, please call and let the staff know what spot you are in and we will come out and get you. For questions call [phone].  Thanks.        Person Under Monitoring Name: Melissa Hobbs  Location: [address] [place] [address]   Infection Prevention Recommendations for Individuals Confirmed to have, or Being Evaluated for, 2019 Novel Coronavirus (COVID-19) Infection Who Receive Care at Home  Individuals who are confirmed to have, or are being evaluated for, COVID-19 should follow the prevention steps below until a healthcare provider or local or state health department says they can return to normal activities.  Stay home except to get medical care You should restrict activities outside your home, except for getting medical care. Do not go to work, school, or public areas, and do not use public transportation or taxis.  Call ahead before visiting your doctor Before your medical appointment, call the healthcare provider and tell them that you have, or are being evaluated for, COVID-19 infection. This will help the healthcare providers office take steps to keep other people from getting infected. Ask your healthcare provider to call the local or state health department.  Monitor your symptoms Seek prompt medical attention if your illness is worsening (e.g., difficulty breathing). Before going to your medical appointment, call the healthcare provider  and tell them that you have, or are being evaluated for, COVID-19 infection. Ask your healthcare provider to call the local or state health department.  Wear a facemask You should wear a facemask that covers your nose and mouth when you are in the same room with other people and when you visit a healthcare provider. People who live with or visit you should also wear a facemask while they are in the same room with you.  Separate yourself from other people in your home As much as possible, you should stay in a different room from other people in your home. Also, you should use a separate bathroom, if available.  Avoid sharing household items You should not share dishes, drinking glasses, cups, eating utensils, towels, bedding, or other items with other people in your home. After using these items, you should wash them thoroughly with soap and water.  Cover your coughs and sneezes Cover your mouth and nose with a tissue when you cough or sneeze, or you can cough or sneeze into your sleeve. Throw used tissues in a lined trash can, and immediately wash your hands with soap and water for at least 20 seconds or use an alcohol-based hand rub.  Wash your [employer] your hands often and thoroughly with soap and water for at least 20 seconds. You can use an alcohol-based hand sanitizer if soap and water are not available and if your hands are not visibly dirty. Avoid touching your eyes, nose, and mouth with unwashed hands.   Prevention Steps for Caregivers and Household Members of Individuals Confirmed to have, or Being Evaluated for,  COVID-19 Infection Being Cared for in the Home  If you live with, or provide care at home for, a person confirmed to have, or being evaluated for, COVID-19 infection please follow these guidelines to prevent infection:  Follow healthcare providers instructions Make sure that you understand and can help the patient follow any healthcare provider instructions for  all care.  Provide for the patients basic needs You should help the patient with basic needs in the home and provide support for getting groceries, prescriptions, and other personal needs.  Monitor the patients symptoms If they are getting sicker, call his or her medical provider and tell them that the patient has, or is being evaluated for, COVID-19 infection. This will help the healthcare providers office take steps to keep other people from getting infected. Ask the healthcare provider to call the local or state health department.  Limit the number of people who have contact with the patient  If possible, have only one caregiver for the patient.  Other household members should stay in another home or place of residence. If this is not possible, they should stay  in another room, or be separated from the patient as much as possible. Use a separate bathroom, if available.  Restrict visitors who do not have an essential need to be in the home.  Keep older adults, very young children, and other sick people away from the patient Keep older adults, very young children, and those who have compromised immune systems or chronic health conditions away from the patient. This includes people with chronic heart, lung, or kidney conditions, diabetes, and cancer.  Ensure good ventilation Make sure that shared spaces in the home have good air flow, such as from an air conditioner or an opened window, weather permitting.  Wash your hands often  Wash your hands often and thoroughly with soap and water for at least 20 seconds. You can use an alcohol based hand sanitizer if soap and water are not available and if your hands are not visibly dirty.  Avoid touching your eyes, nose, and mouth with unwashed hands.  Use disposable paper towels to dry your hands. If not available, use dedicated cloth towels and replace them when they become wet.  Wear a facemask and gloves  Wear a disposable  facemask at all times in the room and gloves when you touch or have contact with the patients blood, body fluids, and/or secretions or excretions, such as sweat, saliva, sputum, nasal mucus, vomit, urine, or feces.  Ensure the mask fits over your nose and mouth tightly, and do not touch it during use.  Throw out disposable facemasks and gloves after using them. Do not reuse.  Wash your hands immediately after removing your facemask and gloves.  If your personal clothing becomes contaminated, carefully remove clothing and launder. Wash your hands after handling contaminated clothing.  Place all used disposable facemasks, gloves, and other waste in a lined container before disposing them with other household waste.  Remove gloves and wash your hands immediately after handling these items.  Do not share dishes, glasses, or other household items with the patient  Avoid sharing household items. You should not share dishes, drinking glasses, cups, eating utensils, towels, bedding, or other items with a patient who is confirmed to have, or being evaluated for, COVID-19 infection.  After the person uses these items, you should wash them thoroughly with soap and water.  Wash laundry thoroughly  Immediately remove and wash clothes or bedding that have blood,  body fluids, and/or secretions or excretions, such as sweat, saliva, sputum, nasal mucus, vomit, urine, or feces, on them.  Wear gloves when handling laundry from the patient.  Read and follow directions on labels of laundry or clothing items and detergent. In general, wash and dry with the warmest temperatures recommended on the label.  Clean all areas the individual has used often  Clean all touchable surfaces, such as counters, tabletops, doorknobs, bathroom fixtures, toilets, phones, keyboards, tablets, and bedside tables, every day. Also, clean any surfaces that may have blood, body fluids, and/or secretions or excretions on them.  Wear  gloves when cleaning surfaces the patient has come in contact with.  Use a diluted bleach solution (e.g., dilute bleach with 1 part bleach and 10 parts water) or a household disinfectant with a label that says EPA-registered for coronaviruses. To make a bleach solution at home, add 1 tablespoon of bleach to 1 quart (4 cups) of water. For a larger supply, add  cup of bleach to 1 gallon (16 cups) of water.  Read labels of cleaning products and follow recommendations provided on product labels. Labels contain instructions for safe and effective use of the cleaning product including precautions you should take when applying the product, such as wearing gloves or eye protection and making sure you have good ventilation during use of the product.  Remove gloves and wash hands immediately after cleaning.  Monitor yourself for signs and symptoms of illness Caregivers and household members are considered close contacts, should monitor their health, and will be asked to limit movement outside of the home to the extent possible. Follow the monitoring steps for close contacts listed on the symptom monitoring form.   ? If you have additional questions, contact your local health department or call the epidemiologist on call at [phone] (available 24/7). ? This guidance is subject to change. For the most up-to-date guidance from [facility], please refer to their website: [employer]

## 2020-05-31 ENCOUNTER — Telehealth (HOSPITAL_COMMUNITY): Payer: Self-pay

## 2020-05-31 ENCOUNTER — Ambulatory Visit (HOSPITAL_COMMUNITY): Payer: 59

## 2020-05-31 ENCOUNTER — Other Ambulatory Visit: Payer: Self-pay

## 2020-05-31 ENCOUNTER — Emergency Department (HOSPITAL_COMMUNITY): Payer: 59

## 2020-05-31 ENCOUNTER — Emergency Department (HOSPITAL_COMMUNITY)
Admission: EM | Admit: 2020-05-31 | Discharge: 2020-05-31 | Disposition: A | Discharge status: Home / Self Care | Payer: 59 | Attending: Emergency Medicine | Admitting: Emergency Medicine

## 2020-05-31 ENCOUNTER — Encounter (HOSPITAL_COMMUNITY): Payer: Self-pay

## 2020-05-31 DIAGNOSIS — G9349 Other encephalopathy: Secondary | ICD-10-CM

## 2020-05-31 DIAGNOSIS — Z79899 Other long term (current) drug therapy: Secondary | ICD-10-CM | POA: Diagnosis not present

## 2020-05-31 DIAGNOSIS — U071 COVID-19: Secondary | ICD-10-CM | POA: Insufficient documentation

## 2020-05-31 DIAGNOSIS — R0902 Hypoxemia: Secondary | ICD-10-CM | POA: Diagnosis present

## 2020-05-31 DIAGNOSIS — J1282 Pneumonia due to coronavirus disease 2019: Secondary | ICD-10-CM | POA: Insufficient documentation

## 2020-05-31 DIAGNOSIS — I10 Essential (primary) hypertension: Secondary | ICD-10-CM | POA: Diagnosis not present

## 2020-05-31 DIAGNOSIS — G9340 Encephalopathy, unspecified: Secondary | ICD-10-CM | POA: Insufficient documentation

## 2020-05-31 LAB — CBC WITH DIFFERENTIAL/PLATELET
Abs Immature Granulocytes: 0.35 10*3/uL — ABNORMAL HIGH (ref 0.00–0.07)
Basophils Absolute: 0.1 10*3/uL (ref 0.0–0.1)
Basophils Relative: 1 %
Eosinophils Absolute: 0 10*3/uL (ref 0.0–0.5)
Eosinophils Relative: 0 %
HCT: 43.8 % (ref 36.0–46.0)
Hemoglobin: 14.8 g/dL (ref 12.0–15.0)
Immature Granulocytes: 4 %
Lymphocytes Relative: 17 %
Lymphs Abs: 1.6 10*3/uL (ref 0.7–4.0)
MCH: 30.5 pg (ref 26.0–34.0)
MCHC: 33.8 g/dL (ref 30.0–36.0)
MCV: 90.3 fL (ref 80.0–100.0)
Monocytes Absolute: 1 10*3/uL (ref 0.1–1.0)
Monocytes Relative: 11 %
Neutro Abs: 6.5 10*3/uL (ref 1.7–7.7)
Neutrophils Relative %: 67 %
Platelets: 298 10*3/uL (ref 150–400)
RBC: 4.85 MIL/uL (ref 3.87–5.11)
RDW: 11.4 % — ABNORMAL LOW (ref 11.5–15.5)
WBC: 9.6 10*3/uL (ref 4.0–10.5)
nRBC: 0 % (ref 0.0–0.2)

## 2020-05-31 LAB — COMPREHENSIVE METABOLIC PANEL
ALT: 28 U/L (ref 0–44)
AST: 23 U/L (ref 15–41)
Albumin: 3.6 g/dL (ref 3.5–5.0)
Alkaline Phosphatase: 65 U/L (ref 38–126)
Anion gap: 8 (ref 5–15)
BUN: 23 mg/dL (ref 8–23)
CO2: 31 mmol/L (ref 22–32)
Calcium: 8.6 mg/dL — ABNORMAL LOW (ref 8.9–10.3)
Chloride: 103 mmol/L (ref 98–111)
Creatinine, Ser: 0.8 mg/dL (ref 0.44–1.00)
GFR, Estimated: >60 mL/min (ref >60)
Glucose, Bld: 122 mg/dL — ABNORMAL HIGH (ref 70–99)
Potassium: 3.6 mmol/L (ref 3.5–5.1)
Sodium: 142 mmol/L (ref 135–145)
Total Bilirubin: 0.8 mg/dL (ref 0.3–1.2)
Total Protein: 6.8 g/dL (ref 6.5–8.1)

## 2020-05-31 LAB — D-DIMER, QUANTITATIVE: D-Dimer, Quant: 1.34 ug/mL-FEU — ABNORMAL HIGH (ref 0.00–0.50)

## 2020-05-31 MED ORDER — CALCIUM CARBONATE ANTACID 500 MG PO CHEW
1.0000 | CHEWABLE_TABLET | Freq: Once | ORAL | Status: AC
  Administered 2020-05-31: 200 mg via ORAL
  Filled 2020-05-31: qty 1

## 2020-05-31 MED ORDER — PREDNISONE 20 MG PO TABS
40.0000 mg | ORAL_TABLET | Freq: Once | ORAL | Status: AC
  Administered 2020-05-31: 40 mg via ORAL
  Filled 2020-05-31: qty 2

## 2020-05-31 NOTE — ED Notes (Signed)
Patients O2 was 92 while lying down and went up to 94 while ambulating.

## 2020-05-31 NOTE — ED Provider Notes (Signed)
[name] Provider Note   CSN: [id number] Arrival date & time: 05/31/20  1201     History Chief Complaint  Patient presents with  . Covid Positive  . hypoxia    Melissa Hobbs is a 65 y.o. female.  HPI     65 year old female with history of COVID-19 comes in a chief complaint of confusion, low oxygen.  I spoke with the patient.  She informs me that she is unsure why she was brought here.  She is not complaining of any shortness of breath, chest pain, dizziness, near fainting, worsening cough.  She reports that her husband was concerned and sent her to the ER.  I spoke with the patient's husband who informs that patient was confused.  She was refusing to take her medications this morning.  Patient was talking out of her mind, and it felt like she might have been hallucinating.  Patient was just admitted to the hospital for COVID-19.  She was discharged yesterday.  Past Medical History:  Diagnosis Date  . Hypertension     Patient Active Problem List   Diagnosis Date Noted  . Pneumonia due to COVID-19 virus 05/27/2020    Past Surgical History:  Procedure Laterality Date  . ABDOMINAL HYSTERECTOMY       OB History   No obstetric history on file.     Family History  Family history unknown: Yes    Social History   Tobacco Use  . Smoking status: Never Smoker  . Smokeless tobacco: Never Used  Vaping Use  . Vaping Use: Never used  Substance Use Topics  . Alcohol use: Never  . Drug use: Never    Home Medications Prior to Admission medications   Medication Sig Start Date End Date Taking? Authorizing Provider  albuterol (VENTOLIN HFA) 108 (90 Base) MCG/ACT inhaler Inhale 2 puffs into the lungs every 4 (four) hours as needed for shortness of breath. 05/30/20   Ghimire, Werner Lean, MD  amLODipine (NORVASC) 5 MG tablet Take 5 mg by mouth daily. Patient not taking: No sig reported 12/31/19   [provider]  benzonatate  (TESSALON) 200 MG capsule Take 1 capsule (200 mg total) by mouth 3 (three) times daily. 05/30/20   Ghimire, Werner Lean, MD  calcium carbonate (TUMS - DOSED IN MG ELEMENTAL CALCIUM) 500 MG chewable tablet Chew 1 tablet by mouth daily.    [provider]  Cholecalciferol (VITAMIN D3) 25 MCG (1000 UT) CAPS Take 1,000 Units by mouth daily.    [provider]  predniSONE (DELTASONE) 10 MG tablet Take 40 mg daily for 1 day, 30 mg daily for 1 day, 20 mg daily for 1 days,10 mg daily for 1 day, then stop 05/30/20   Maretta Bees, MD    Allergies    Penicillins  Review of Systems   Review of Systems  Constitutional: Negative for activity change.  Respiratory: Negative for shortness of breath.   Cardiovascular: Negative for chest pain.  Gastrointestinal: Negative for nausea and vomiting.  Allergic/Immunologic: Negative for immunocompromised state.  Neurological: Negative for dizziness.  All other systems reviewed and are negative.   Physical Exam Updated Vital Signs BP (!) 111/59 (BP Location: Left Arm)   Pulse 69   Temp 98.3 F (36.8 C) (Oral)   Resp 14   Ht 5\' 1"  (1.549 m)   Wt 65 kg   SpO2 92%   BMI 27.08 kg/m   Physical Exam Vitals and nursing note reviewed.  Constitutional:      General: She is not in acute distress.    Appearance: She is well-developed. She is not ill-appearing.  HENT:     Head: Normocephalic and atraumatic.  Eyes:     Extraocular Movements: EOM normal.  Cardiovascular:     Rate and Rhythm: Normal rate.  Pulmonary:     Effort: Pulmonary effort is normal.  Abdominal:     General: Bowel sounds are normal.  Musculoskeletal:     Cervical back: Normal range of motion and neck supple.  Skin:    General: Skin is warm and dry.  Neurological:     Mental Status: She is oriented to person, place, and time.     ED Results / Procedures / Treatments   Labs (all labs ordered are listed, but only abnormal results are displayed) Labs Reviewed   COMPREHENSIVE METABOLIC PANEL - Abnormal; Notable for the following components:      Result Value   Glucose, Bld 122 (*)    Calcium 8.6 (*)    All other components within normal limits  CBC WITH DIFFERENTIAL/PLATELET - Abnormal; Notable for the following components:   RDW 11.4 (*)    Abs Immature Granulocytes 0.35 (*)    All other components within normal limits  D-DIMER, QUANTITATIVE - Abnormal; Notable for the following components:   D-Dimer, Quant 1.34 (*)    All other components within normal limits    EKG None  Radiology DG Chest 2 View  Result Date: 05/31/2020 CLINICAL DATA:  Hypoxia.  Recent COVID pneumonia EXAM: CHEST - 2 VIEW COMPARISON:  05/27/2020 FINDINGS: Patchy bilateral peripheral infiltrates with interval improvement. No new area of consolidation. No effusion. Heart size and vascularity normal. IMPRESSION: Multifocal bilateral infiltrates compatible with known COVID pneumonia. Interval improvement from 05/27/2020 Electronically Signed   By: Marlan Palau M.D.   On: 05/31/2020 14:17    Procedures Procedures   Medications Ordered in ED Medications  predniSONE (DELTASONE) tablet 40 mg (has no administration in time range)  calcium carbonate (TUMS - dosed in mg elemental calcium) chewable tablet 200 mg of elemental calcium (has no administration in time range)    ED Course  I have reviewed the triage vital signs and the nursing notes.  Pertinent labs & imaging results that were available during my care of the patient were reviewed by me and considered in my medical decision making (see chart for details).  Clinical Course as of 05/31/20 2129  Tue May 31, 2020  2127 D-Dimer, Quant(!): 1.34 D-dimer is only mildly elevated compared to when the patient was seen primarily.  Pretest probability for PE is extremely low, we will not get a CT PE for a diameter of 1.34, in the setting of patient not having any pleuritic pain or hypoxia. [AN]  2127 Patient reassessed.   She still answering my questions appropriately.  She was ambulated, there was no hypoxia.  I spoke with patient's husband.  Updated him on her findings.  Stable for discharge. [AN]    Clinical Course User Index [AN] Derwood Kaplan, MD   MDM Rules/Calculators/A&P                          ROSHAN SALAMON was evaluated in Emergency Department on 05/31/2020 for the symptoms described in the history of present illness. She was evaluated in the context of the global COVID-19 pandemic, which necessitated consideration that the patient might be at risk for infection with the  SARS-CoV-2 virus that causes COVID-19. Institutional protocols and algorithms that pertain to the evaluation of patients at risk for COVID-19 are in a state of rapid change based on information released by regulatory bodies including the CDC and federal and state organizations. These policies and algorithms were followed during the patient's care in the ED.  65 year old comes in w/ chief complaint of confusion.  Patient has no complaints from her side.  I have reviewed her chart from recent admission, at which time she was admitted for COVID-19 and resultant respiratory issues.  She has multifocal pneumonia.  X-ray ordered, there is interval improvement in her infiltrates today compared to the x-ray from 2-18.  Patient is not hypoxic during my assessment and there is no evidence of any respiratory distress or even tachypnea.  She is not confused and answers all my questions appropriately.  Anticipate discharge, we will get some labs to ensure that there is no metabolic abnormalities or x-ray showing any concerning findings.  We will also get D-dimer -pretest probability for PE is low, but if the D-dimer is significantly elevated then we will proceed with a CT PE.  Final Clinical Impression(s) / ED Diagnoses Final diagnoses:  Encephalopathy due to COVID-19 virus  COVID-19    Rx / DC Orders ED Discharge Orders    None        Derwood Kaplan, MD 05/31/20 2138

## 2020-05-31 NOTE — ED Triage Notes (Signed)
Per EMS- patient was discharged from the hospital yesterday with Covid pneumonia. Patient was discharged with no oxygen. EMS reports that the patient had room air sats at 88% upon arrival. O2 4l/min via [place] placed on the patient and sats increased to 100%

## 2020-05-31 NOTE — Telephone Encounter (Signed)
Patient's husband called to report that the patient will not be attending her appointment this afternoon for last dose of remdesivir. Reports that she is confused and disoriented since being home. Instructed family to take her to the ED for re-evaluation since this onset is new. Husband isn't sure he can get her to go but will try. Appointment cancelled.   Lamona Eimer Loyola Mast, RN

## 2020-05-31 NOTE — Discharge Instructions (Addendum)
Start prednisone tomorrow. Return to the ER if there is worsening confusion.

## 2020-06-01 LAB — CULTURE, BLOOD (ROUTINE X 2): Culture: NO GROWTH

## 2020-06-02 LAB — CULTURE, BLOOD (ROUTINE X 2)
Culture: NO GROWTH
Special Requests: ADEQUATE

## 2020-06-07 ENCOUNTER — Ambulatory Visit (INDEPENDENT_AMBULATORY_CARE_PROVIDER_SITE_OTHER): Payer: 59 | Admitting: Nurse Practitioner

## 2020-06-07 VITALS — BP 114/58 | HR 74 | Temp 97.5°F | Wt 137.0 lb

## 2020-06-07 DIAGNOSIS — U071 COVID-19: Secondary | ICD-10-CM | POA: Diagnosis not present

## 2020-06-07 DIAGNOSIS — J1282 Pneumonia due to coronavirus disease 2019: Secondary | ICD-10-CM

## 2020-06-07 NOTE — Patient Instructions (Signed)
Covid pneumonia:   Stay well hydrated  Stay active  Deep breathing exercises  May start vitamin C daily, vitamin D3 daily, Zinc daily  May take tylenol for fever or pain   Follow up:  Follow up in 3 weeks or sooner if needed - will need repeat imaging

## 2020-06-07 NOTE — Progress Notes (Signed)
@Patient  ID: , female    DOB: 02-Sep-1955, 65 y.o.   MRN: [id number]  Chief Complaint  Patient presents with  . Follow-up    Referring provider: [id number] [facility]*    HPI  Patient presents today for post COVID care clinic visit.  Patient was admitted to the hospital on 05/27/2020 and discharged on 05/30/2020.  She was admitted for acute hypoxic respiratory failure due to Covid pneumonia.  Patient returned to the ED on 05/31/2020 due to confusion.  Labs and EKG were overall normal.  Chest x-ray showed improving Covid pneumonia.  Patient was discharged home.  Patient states that she has been doing well since her last hospital visit.  She has not had any more periods of confusion.  She is try to stay active.  She states that she is eating well and trying to stay well-hydrated.  She denies any issues with her bowels. Denies f/c/s, n/v/d, hemoptysis, PND, chest pain or edema.      Allergies  Allergen Reactions  . Penicillins Hives and Swelling     There is no immunization history on file for this patient.  Past Medical History:  Diagnosis Date  . Hypertension     Tobacco History: Social History   Tobacco Use  Smoking Status Never Smoker  Smokeless Tobacco Never Used   Counseling given: Not Answered   Outpatient Encounter Medications as of 06/07/2020  Medication Sig  . albuterol (VENTOLIN HFA) 108 (90 Base) MCG/ACT inhaler Inhale 2 puffs into the lungs every 4 (four) hours as needed for shortness of breath.  08/07/2020 amLODipine (NORVASC) 5 MG tablet Take 5 mg by mouth daily. (Patient not taking: No sig reported)  . benzonatate (TESSALON) 200 MG capsule Take 1 capsule (200 mg total) by mouth 3 (three) times daily.  . calcium carbonate (TUMS - DOSED IN MG ELEMENTAL CALCIUM) 500 MG chewable tablet Chew 1 tablet by mouth daily.  . Cholecalciferol (VITAMIN D3) 25 MCG (1000 UT) CAPS Take 1,000 Units by mouth daily.  . predniSONE (DELTASONE) 10 MG tablet Take 40 mg daily  for 1 day, 30 mg daily for 1 day, 20 mg daily for 1 days,10 mg daily for 1 day, then stop   No facility-administered encounter medications on file as of 06/07/2020.     Review of Systems  Review of Systems  Constitutional: Negative.  Negative for fatigue and fever.  HENT: Negative.   Respiratory: Negative for cough and shortness of breath.   Cardiovascular: Negative.  Negative for chest pain, palpitations and leg swelling.  Gastrointestinal: Negative.   Allergic/Immunologic: Negative.   Neurological: Negative.   Psychiatric/Behavioral: Negative.        Physical Exam  BP (!) 114/58 (BP Location: Left Arm, Patient Position: Sitting)   Pulse 74   Temp (!) 97.5 F (36.4 C) (Skin)   Wt 137 lb (62.1 kg)   SpO2 98%   BMI 25.89 kg/m   Wt Readings from Last 5 Encounters:  06/07/20 137 lb (62.1 kg)  05/31/20 143 lb 4.8 oz (65 kg)  05/30/20 143 lb 4.8 oz (65 kg)     Physical Exam Vitals and nursing note reviewed.  Constitutional:      General: She is not in acute distress.    Appearance: She is well-developed and well-nourished.  Cardiovascular:     Rate and Rhythm: Normal rate and regular rhythm.  Pulmonary:     Effort: Pulmonary effort is normal.     Breath sounds: Normal breath sounds.  Musculoskeletal:     Right lower leg: No edema.     Left lower leg: No edema.  Neurological:     Mental Status: She is alert and oriented to person, place, and time.  Psychiatric:        Mood and Affect: Mood and affect and mood normal.        Behavior: Behavior normal.         Assessment & Plan:   Pneumonia due to COVID-19 virus Stay well hydrated  Stay active  Deep breathing exercises  May start vitamin C daily, vitamin D3 daily, Zinc daily  May take tylenol for fever or pain   Follow up:  Follow up in 3 weeks or sooner if needed - will need repeat imaging       Ivonne Andrew, NP 06/09/2020

## 2020-06-09 NOTE — Assessment & Plan Note (Signed)
Stay well hydrated  Stay active  Deep breathing exercises  May start vitamin C daily, vitamin D3 daily, Zinc daily  May take tylenol for fever or pain   Follow up:  Follow up in 3 weeks or sooner if needed - will need repeat imaging

## 2020-06-28 ENCOUNTER — Ambulatory Visit
Admission: RE | Admit: 2020-06-28 | Discharge: 2020-06-28 | Disposition: A | Discharge status: Home / Self Care | Payer: 59 | Source: Ambulatory Visit | Attending: Nurse Practitioner | Admitting: Nurse Practitioner

## 2020-06-28 ENCOUNTER — Ambulatory Visit (INDEPENDENT_AMBULATORY_CARE_PROVIDER_SITE_OTHER): Payer: 59 | Admitting: Nurse Practitioner

## 2020-06-28 ENCOUNTER — Other Ambulatory Visit: Payer: Self-pay

## 2020-06-28 VITALS — BP 119/71 | HR 90 | Temp 97.9°F | Resp 18

## 2020-06-28 DIAGNOSIS — U071 COVID-19: Secondary | ICD-10-CM

## 2020-06-28 DIAGNOSIS — J1282 Pneumonia due to coronavirus disease 2019: Secondary | ICD-10-CM | POA: Diagnosis not present

## 2020-06-28 NOTE — Patient Instructions (Signed)
Covid pneumonia:   Stay well hydrated  Stay active  Deep breathing exercises  May take tylenol for fever or pain  May take mucinex twice daily   Will order chest x ray:  [facility] [address] [place], [place] [address] [phone] MON - FRI 8:00 AM - 4:00 PM - WALK IN   Follow up:  Follow up in 4 weeks or sooner if needed

## 2020-06-28 NOTE — Assessment & Plan Note (Signed)
Covid pneumonia:   Stay well hydrated  Stay active  Deep breathing exercises  May take tylenol for fever or pain  May take mucinex twice daily   Will order chest x ray:  [facility] [address] [place], [place] [address] [phone] MON - FRI 8:00 AM - 4:00 PM - WALK IN   Follow up:  Follow up in 4 weeks or sooner if needed  

## 2020-06-28 NOTE — Progress Notes (Signed)
@Patient  ID: , female    DOB: Feb 26, 1956, 65 y.o.   MRN: [id number]  Chief Complaint  Patient presents with  . Follow-up    Much improved    Referring provider: [id number] [facility]*  HPI  Patient presents today for post COVID care clinic visit.  Patient was last seen in our office on 06/07/2020 for hospital follow-up due to Covid pneumonia.  She will need repeat imaging today.  Overall patient states that she has been doing well.  She did have confusion when admitted to the hospital.  She states that she is doing much better at this point.  We discussed that some patients do have brain fog and memory loss after Covid and to let 08/07/2020 know if she has issues with this.  We will follow-up with her in 1 month to make sure she is doing okay as far as mental status and breathing is concerned.  Patient is trying to stay active.  She has returned to work. Denies f/c/s, n/v/d, hemoptysis, PND, chest pain or edema.     Allergies  Allergen Reactions  . Penicillins Hives and Swelling     There is no immunization history on file for this patient.  Past Medical History:  Diagnosis Date  . Hypertension     Tobacco History: Social History   Tobacco Use  Smoking Status Never Smoker  Smokeless Tobacco Never Used   Counseling given: Yes   Outpatient Encounter Medications as of 06/28/2020  Medication Sig  . albuterol (VENTOLIN HFA) 108 (90 Base) MCG/ACT inhaler Inhale 2 puffs into the lungs every 4 (four) hours as needed for shortness of breath.  06/30/2020 amLODipine (NORVASC) 5 MG tablet Take 5 mg by mouth daily. (Patient not taking: No sig reported)  . benzonatate (TESSALON) 200 MG capsule Take 1 capsule (200 mg total) by mouth 3 (three) times daily.  . calcium carbonate (TUMS - DOSED IN MG ELEMENTAL CALCIUM) 500 MG chewable tablet Chew 1 tablet by mouth daily.  . Cholecalciferol (VITAMIN D3) 25 MCG (1000 UT) CAPS Take 1,000 Units by mouth daily.  . predniSONE (DELTASONE) 10 MG  tablet Take 40 mg daily for 1 day, 30 mg daily for 1 day, 20 mg daily for 1 days,10 mg daily for 1 day, then stop   No facility-administered encounter medications on file as of 06/28/2020.     Review of Systems  Review of Systems  Constitutional: Negative.  Negative for fatigue and fever.  HENT: Negative.   Respiratory: Negative for cough and shortness of breath.   Cardiovascular: Negative.  Negative for chest pain, palpitations and leg swelling.  Gastrointestinal: Negative.   Allergic/Immunologic: Negative.   Neurological: Negative.   Psychiatric/Behavioral: Negative.        Physical Exam  BP 119/71   Pulse 90   Temp 97.9 F (36.6 C)   Resp 18   SpO2 98%   Wt Readings from Last 5 Encounters:  06/07/20 137 lb (62.1 kg)  05/31/20 143 lb 4.8 oz (65 kg)  05/30/20 143 lb 4.8 oz (65 kg)     Physical Exam Vitals and nursing note reviewed.  Constitutional:      General: She is not in acute distress.    Appearance: She is well-developed.  Cardiovascular:     Rate and Rhythm: Normal rate and regular rhythm.  Pulmonary:     Effort: Pulmonary effort is normal.     Breath sounds: Normal breath sounds.  Musculoskeletal:     Right  lower leg: No edema.     Left lower leg: No edema.  Neurological:     Mental Status: She is alert and oriented to person, place, and time.  Psychiatric:        Mood and Affect: Mood normal.        Behavior: Behavior normal.       Imaging: DG Chest 2 View  Result Date: 05/31/2020 CLINICAL DATA:  Hypoxia.  Recent COVID pneumonia EXAM: CHEST - 2 VIEW COMPARISON:  05/27/2020 FINDINGS: Patchy bilateral peripheral infiltrates with interval improvement. No new area of consolidation. No effusion. Heart size and vascularity normal. IMPRESSION: Multifocal bilateral infiltrates compatible with known COVID pneumonia. Interval improvement from 05/27/2020 Electronically Signed   By: Marlan Palau M.D.   On: 05/31/2020 14:17     Assessment & Plan:    Pneumonia due to COVID-19 virus Covid pneumonia:   Stay well hydrated  Stay active  Deep breathing exercises  May take tylenol for fever or pain  May take mucinex twice daily   Will order chest x ray:  [facility] [address] [place], [place] [address] [phone] MON - FRI 8:00 AM - 4:00 PM - WALK IN   Follow up:  Follow up in 4 weeks or sooner if needed      Ivonne Andrew, NP 06/28/2020

## 2020-07-28 ENCOUNTER — Ambulatory Visit: Payer: 59

## 2022-04-07 ENCOUNTER — Encounter (HOSPITAL_COMMUNITY): Payer: Self-pay

## 2022-04-07 ENCOUNTER — Emergency Department (HOSPITAL_COMMUNITY): Payer: Medicare Other

## 2022-04-07 ENCOUNTER — Ambulatory Visit (INDEPENDENT_AMBULATORY_CARE_PROVIDER_SITE_OTHER): Payer: Medicare Other

## 2022-04-07 ENCOUNTER — Ambulatory Visit (HOSPITAL_COMMUNITY)
Admission: EM | Admit: 2022-04-07 | Discharge: 2022-04-07 | Disposition: A | Discharge status: Home / Self Care | Payer: Medicare Other | Attending: Urgent Care | Admitting: Urgent Care

## 2022-04-07 ENCOUNTER — Emergency Department (HOSPITAL_COMMUNITY)
Admission: EM | Admit: 2022-04-07 | Discharge: 2022-04-07 | Disposition: A | Discharge status: Home / Self Care | Payer: Medicare Other | Attending: Emergency Medicine | Admitting: Emergency Medicine

## 2022-04-07 ENCOUNTER — Other Ambulatory Visit: Payer: Self-pay

## 2022-04-07 DIAGNOSIS — I10 Essential (primary) hypertension: Secondary | ICD-10-CM | POA: Insufficient documentation

## 2022-04-07 DIAGNOSIS — Z1152 Encounter for screening for COVID-19: Secondary | ICD-10-CM | POA: Insufficient documentation

## 2022-04-07 DIAGNOSIS — N200 Calculus of kidney: Secondary | ICD-10-CM

## 2022-04-07 DIAGNOSIS — N201 Calculus of ureter: Secondary | ICD-10-CM

## 2022-04-07 DIAGNOSIS — R109 Unspecified abdominal pain: Secondary | ICD-10-CM | POA: Diagnosis present

## 2022-04-07 DIAGNOSIS — N132 Hydronephrosis with renal and ureteral calculous obstruction: Secondary | ICD-10-CM | POA: Diagnosis not present

## 2022-04-07 DIAGNOSIS — R319 Hematuria, unspecified: Secondary | ICD-10-CM

## 2022-04-07 DIAGNOSIS — Z79899 Other long term (current) drug therapy: Secondary | ICD-10-CM | POA: Insufficient documentation

## 2022-04-07 LAB — URINALYSIS, ROUTINE W REFLEX MICROSCOPIC
Bilirubin Urine: NEGATIVE
Glucose, UA: NEGATIVE mg/dL
Ketones, ur: NEGATIVE mg/dL
Nitrite: NEGATIVE
Protein, ur: 100 mg/dL — AB
Specific Gravity, Urine: 1.019 (ref 1.005–1.030)
WBC, UA: >50 WBC/hpf — ABNORMAL HIGH (ref 0–5)
pH: 5 (ref 5.0–8.0)

## 2022-04-07 LAB — POCT URINALYSIS DIPSTICK, ED / UC
Bilirubin Urine: NEGATIVE
Glucose, UA: NEGATIVE mg/dL
Ketones, ur: NEGATIVE mg/dL
Leukocytes,Ua: NEGATIVE
Nitrite: NEGATIVE
Protein, ur: >=300 mg/dL — AB
Specific Gravity, Urine: >=1.03 (ref 1.005–1.030)
Urobilinogen, UA: 0.2 mg/dL (ref 0.0–1.0)
pH: 5.5 (ref 5.0–8.0)

## 2022-04-07 LAB — CBC
HCT: 45 % (ref 36.0–46.0)
Hemoglobin: 14.9 g/dL (ref 12.0–15.0)
MCH: 30.2 pg (ref 26.0–34.0)
MCHC: 33.1 g/dL (ref 30.0–36.0)
MCV: 91.3 fL (ref 80.0–100.0)
Platelets: 225 10*3/uL (ref 150–400)
RBC: 4.93 MIL/uL (ref 3.87–5.11)
RDW: 12.2 % (ref 11.5–15.5)
WBC: 16.2 10*3/uL — ABNORMAL HIGH (ref 4.0–10.5)
nRBC: 0 % (ref 0.0–0.2)

## 2022-04-07 LAB — COMPREHENSIVE METABOLIC PANEL
ALT: 29 U/L (ref 0–44)
AST: 29 U/L (ref 15–41)
Albumin: 4.1 g/dL (ref 3.5–5.0)
Alkaline Phosphatase: 73 U/L (ref 38–126)
Anion gap: 10 (ref 5–15)
BUN: 18 mg/dL (ref 8–23)
CO2: 27 mmol/L (ref 22–32)
Calcium: 10.4 mg/dL — ABNORMAL HIGH (ref 8.9–10.3)
Chloride: 98 mmol/L (ref 98–111)
Creatinine, Ser: 1.62 mg/dL — ABNORMAL HIGH (ref 0.44–1.00)
GFR, Estimated: 34 mL/min — ABNORMAL LOW (ref >60)
Glucose, Bld: 150 mg/dL — ABNORMAL HIGH (ref 70–99)
Potassium: 3.6 mmol/L (ref 3.5–5.1)
Sodium: 135 mmol/L (ref 135–145)
Total Bilirubin: 0.5 mg/dL (ref 0.3–1.2)
Total Protein: 6.8 g/dL (ref 6.5–8.1)

## 2022-04-07 MED ORDER — TAMSULOSIN HCL 0.4 MG PO CAPS: 0.4000 mg | ORAL_CAPSULE | Freq: Every day | ORAL | 0 refills | Status: DC

## 2022-04-07 MED ORDER — LACTATED RINGERS IV BOLUS
1000.0000 mL | Freq: Once | INTRAVENOUS | Status: AC
  Administered 2022-04-07: 1000 mL via INTRAVENOUS

## 2022-04-07 MED ORDER — ACETAMINOPHEN 325 MG PO TABS
650.0000 mg | ORAL_TABLET | ORAL | Status: AC
  Administered 2022-04-07: 650 mg via ORAL
  Filled 2022-04-07: qty 2

## 2022-04-07 MED ORDER — OXYCODONE-ACETAMINOPHEN 5-325 MG PO TABS: 1.0000 | ORAL_TABLET | Freq: Four times a day (QID) | ORAL | 0 refills | Status: DC | PRN

## 2022-04-07 MED ORDER — NITROFURANTOIN MONOHYD MACRO 100 MG PO CAPS: 100.0000 mg | ORAL_CAPSULE | Freq: Two times a day (BID) | ORAL | 0 refills | Status: DC

## 2022-04-07 MED ORDER — ONDANSETRON HCL 4 MG/2ML IJ SOLN
4.0000 mg | Freq: Once | INTRAMUSCULAR | Status: AC
  Administered 2022-04-07: 4 mg via INTRAVENOUS
  Filled 2022-04-07: qty 2

## 2022-04-07 MED ORDER — FENTANYL CITRATE PF 50 MCG/ML IJ SOSY
50.0000 ug | PREFILLED_SYRINGE | INTRAMUSCULAR | Status: DC | PRN
  Administered 2022-04-07: 50 ug via INTRAVENOUS
  Filled 2022-04-07: qty 1

## 2022-04-07 MED ORDER — OXYCODONE-ACETAMINOPHEN 5-325 MG PO TABS
1.0000 | ORAL_TABLET | Freq: Once | ORAL | Status: AC
  Administered 2022-04-07: 1 via ORAL
  Filled 2022-04-07: qty 1

## 2022-04-07 NOTE — ED Provider Notes (Signed)
[facility]    CSN: [id number] Arrival date & time: 04/07/22  1127      History   Chief Complaint Chief Complaint  Patient presents with   Dysuria    HPI Melissa Hobbs is a 66 y.o. female.   Pleasant 66 year old female presents today due to concerns of right flank/abdominal pain.  She has a known history of nephrolithiasis.  Was able to review a CT scan from 2019 which showed that even a 2 mm stone in the past has caused hydronephrosis.  Patient has hydrocodone for intermittent flank pain/renal colic.  She states her first symptoms started on Christmas Day.  She took a hydrocodone and the pain went away.  She reports 2 days later it came back, took a hydrocodone and was gone.  She states since yesterday however she has had right flank/right upper quadrant abdominal pain, and the pain has not responded to the hydrocodone this time.  She states this morning, she had a few episodes of vomiting, primarily clear fluids.  She denies any diarrhea or abdominal distention.  She denies a fever.  She states earlier in the week she did see blood in her urine, but this has since resolved.  She did notice some blood on the toilet paper when wiping in our office.  She does not follow with a nephrologist or urologist.  She denies any history of chronic kidney disease.  She does not take Flomax.  She said she tried to increase her water intake to help with a possible stone passage, but over the past 24 hours has not been able to drink much water.  She has not eaten anything today. She denies increased frequency of urine, dysuria, or frequency. No oliguria.   Dysuria Associated symptoms: abdominal pain (RUQ), flank pain, nausea and vomiting   Associated symptoms: no fever     Past Medical History:  Diagnosis Date   Hypertension     Patient Active Problem List   Diagnosis Date Noted   Pneumonia due to COVID-19 virus 05/27/2020    Past Surgical History:  Procedure Laterality Date    ABDOMINAL HYSTERECTOMY      OB History   No obstetric history on file.      Home Medications    Prior to Admission medications   Medication Sig Start Date End Date Taking? Authorizing Provider  albuterol (VENTOLIN HFA) 108 (90 Base) MCG/ACT inhaler Inhale 2 puffs into the lungs every 4 (four) hours as needed for shortness of breath. 05/30/20   Ghimire, Werner Lean, MD  amLODipine (NORVASC) 5 MG tablet Take 5 mg by mouth daily. Patient not taking: Reported on 05/27/2020 12/31/19   [provider]  benzonatate (TESSALON) 200 MG capsule Take 1 capsule (200 mg total) by mouth 3 (three) times daily. 05/30/20   Ghimire, Werner Lean, MD  calcium carbonate (TUMS - DOSED IN MG ELEMENTAL CALCIUM) 500 MG chewable tablet Chew 1 tablet by mouth daily.    [provider]  Cholecalciferol (VITAMIN D3) 25 MCG (1000 UT) CAPS Take 1,000 Units by mouth daily.    [provider]  predniSONE (DELTASONE) 10 MG tablet Take 40 mg daily for 1 day, 30 mg daily for 1 day, 20 mg daily for 1 days,10 mg daily for 1 day, then stop 05/30/20   Ghimire, Werner Lean, MD    Family History Family History  Family history unknown: Yes    Social History Social History   Tobacco Use   Smoking status: Never  Smokeless tobacco: Never  Vaping Use   Vaping Use: Never used  Substance Use Topics   Alcohol use: Never   Drug use: Never     Allergies   Penicillins   Review of Systems Review of Systems  Constitutional:  Negative for fever.  Gastrointestinal:  Positive for abdominal pain (RUQ), nausea and vomiting.  Genitourinary:  Positive for flank pain and hematuria. Negative for decreased urine volume and dysuria.     Physical Exam Triage Vital Signs ED Triage Vitals  Enc Vitals Group     BP 04/07/22 1330 91/60     Pulse Rate 04/07/22 1330 95     Resp 04/07/22 1330 16     Temp --      Temp Source 04/07/22 1330 Oral     SpO2 04/07/22 1330 96 %     Weight --      Height --      Head  Circumference --      Peak Flow --      Pain Score 04/07/22 1328 10     Pain Loc --      Pain Edu? --      Excl. in GC? --    No data found.  Updated Vital Signs BP 91/60 (BP Location: Left Arm)   Pulse 95   Resp 16   SpO2 96%   Visual Acuity Right Eye Distance:   Left Eye Distance:   Bilateral Distance:    Right Eye Near:   Left Eye Near:    Bilateral Near:     Physical Exam Vitals and nursing note reviewed.  Constitutional:      General: She is in acute distress (mildly uncomfortable).     Appearance: Normal appearance. She is well-developed. She is not toxic-appearing or diaphoretic.  HENT:     Head: Normocephalic and atraumatic.  Eyes:     Conjunctiva/sclera: Conjunctivae normal.  Cardiovascular:     Rate and Rhythm: Normal rate and regular rhythm.     Heart sounds: No murmur heard. Pulmonary:     Effort: Pulmonary effort is normal. No respiratory distress.     Breath sounds: Normal breath sounds.  Abdominal:     General: Abdomen is flat. Bowel sounds are normal. There is no distension.     Palpations: Abdomen is soft. There is no hepatomegaly or splenomegaly.     Tenderness: There is abdominal tenderness in the right upper quadrant. There is right CVA tenderness. There is no left CVA tenderness, guarding or rebound. Positive signs include Murphy's sign. Negative signs include Rovsing's sign, McBurney's sign and psoas sign.     Hernia: No hernia is present.  Musculoskeletal:        General: No swelling.     Cervical back: Neck supple.  Skin:    General: Skin is warm and dry.     Capillary Refill: Capillary refill takes less than 2 seconds.     Coloration: Skin is not jaundiced.     Findings: No bruising, erythema or rash.  Neurological:     Mental Status: She is alert.  Psychiatric:        Mood and Affect: Mood normal.      UC Treatments / Results  Labs (all labs ordered are listed, but only abnormal results are displayed) Labs Reviewed  POCT  URINALYSIS DIPSTICK, ED / UC - Abnormal; Notable for the following components:      Result Value   Hgb urine dipstick LARGE (*)    Protein,  ur >=300 (*)    All other components within normal limits    EKG   Radiology DG Abd 1 View  Result Date: 04/07/2022 CLINICAL DATA:  Right-sided abdominal pain EXAM: ABDOMEN - 1 VIEW COMPARISON:  09/07/2017 FINDINGS: The bowel gas pattern is normal. Large staghorn calculus projecting over the right renal shadow measuring approximately 2.8 x 2.6 cm. Several punctate calcifications project over the left renal shadow, largest measuring 0.8 cm. Small phleboliths in the low right pelvis. IMPRESSION: 1. Large staghorn calculus projecting over the right renal shadow. 2. Punctate left renal calculi. Electronically Signed   By: Duanne Guess D.O.   On: 04/07/2022 14:28    Procedures Procedures (including critical care time)  Medications Ordered in UC Medications - No data to display  Initial Impression / Assessment and Plan / UC Course  I have reviewed the triage vital signs and the nursing notes.  Pertinent labs & imaging results that were available during my care of the patient were reviewed by me and considered in my medical decision making (see chart for details).     Staghorn calculus -KUB obtained in office, 2.8 x 2.6 cm staghorn calculus noted in the right kidney.  This is likely the cause of her severe pain.  We did discuss treatment options to include both ER evaluation and outpatient management.  Patient states she is in severe pain, not responding to her normal hydrocodone.  Therefore, will discharge to the emergency room to appreciate recommendations from specialist. Acute right flank pain -UA dipstick does not show evidence of infection, however there is a high likelihood of urease-producing pathogen given the presence of struvite/ staghorn calculus. Pt will likely require cx and further eval to ensure pyelonephritis not also present. Pt  currently afebrile and stable for discharge to ER Hematuria - secondary to #1 and #2 Nephrolithiasis - numerous stones noted to both R and L kidney and ureters. Will defer management to specialist.   Final Clinical Impressions(s) / UC Diagnoses   Final diagnoses:  Staghorn calculus  Acute right flank pain  Hematuria, unspecified type  Nephrolithiasis     Discharge Instructions      You have a 2.8 cm staghorn calculus in your right kidney which is likely the cause of your acute right flank pain. Given its size, and the history of hydronephrosis in the past with much smaller ones, I would recommend an ER evaluation to determine the appropriate management. Please head to the emergency department for further evaluation and workup.     ED Prescriptions   None    PDMP not reviewed this encounter.   Maretta Bees, [place] 04/07/22 [phone]

## 2022-04-07 NOTE — ED Triage Notes (Signed)
Pt is here for possible UTI X5 DAYS

## 2022-04-07 NOTE — ED Triage Notes (Signed)
Patient sent to urgent care for further management and evaluation of a staghorn calculus in right kidney. Patient is alert, oriented, ambulating independently with steady gait, and is in no apparent distress at this time.

## 2022-04-07 NOTE — ED Notes (Signed)
Patient sent here from urgent care. She was evaluated for right flank pain, patient states onset on christmas day. She was told at urgent care that she has staghorn stone seen on x-ray. She states feel frequent urge to urinate but she does not put out much urine, and she sees blood in the end of urine stream. Patient states took left over oxycodone that was prescribed for another ailment that helped with pain. Currently rates pain 6/10. Patient reports Hx of staghorn stone.

## 2022-04-07 NOTE — Discharge Instructions (Addendum)
You had a 4mm kidney stone located in your ureter. Strain all urine with a strainer that was provided to you.  Make sure you drink plenty of water to maintain hydration.  Please use Tylenol or ibuprofen for pain.  You may use 600 mg ibuprofen every 6 hours or 1000 mg of Tylenol every 6 hours.  You may choose to alternate between the 2.  This would be most effective.  Not to exceed 4 g of Tylenol within 24 hours.  Not to exceed 3200 mg ibuprofen 24 hours.     I have prescribed or offered you other medicine for breakthrough pain. Notably there is a small amount of Tylenol--specifically 325 mg--in each dose of Percocet.  If you do take a dose of Percocet please take a 500 mg instead of the 1000 mg dose of Tylenol.     Follow up with [facility] I have given you the information for their office.  Generally kidney stones pass on their own given time in the focus of treatment is minimizing pain   

## 2022-04-07 NOTE — ED Provider Notes (Addendum)
[facility] EMERGENCY DEPARTMENT Provider Note   CSN: [id number] Arrival date & time: 04/07/22  1733     History  Chief Complaint  Patient presents with   Flank Pain    Melissa Hobbs is a 66 y.o. female.   Flank Pain  Patient is a 67 year old female with a past medical history significant for a history of 2 kidney stones use apart most recently 2019, hypertension, past surgical history notable for abdominal hysterectomy  She states she is otherwise quite healthy  She presents emergency room today with complaints of right flank pain that began on Christmas Day but resolved with 1 dose of oral pain medicine.  She endorses numerous episodes of nausea and vomiting today and states that her pain returned a few days ago after complete resolution with 1 dose of Percocet.  She denies any chest pain or difficulty breathing.  She states her abdominal pain seems to be diffuse but certainly persistently painful in the right flank.  No lightheadedness or dizziness.  She denies any fevers.  She states she has had some blood in her urine she denies any dysuria or urinary frequency.  Denies any bowel or bladder incontinence.  Denies any blood in stool.     Home Medications Prior to Admission medications   Medication Sig Start Date End Date Taking? Authorizing Provider  nitrofurantoin, macrocrystal-monohydrate, (MACROBID) 100 MG capsule Take 1 capsule (100 mg total) by mouth 2 (two) times daily. 04/07/22  Yes Neviah Braud, Stevphen Meuse S, PA  oxyCODONE-acetaminophen (PERCOCET/ROXICET) 5-325 MG tablet Take 1 tablet by mouth every 6 (six) hours as needed for severe pain. 04/07/22  Yes Solon Augusta S, PA  tamsulosin (FLOMAX) 0.4 MG CAPS capsule Take 1 capsule (0.4 mg total) by mouth daily after breakfast. 04/07/22  Yes Javis Abboud S, PA  albuterol (VENTOLIN HFA) 108 (90 Base) MCG/ACT inhaler Inhale 2 puffs into the lungs every 4 (four) hours as needed for shortness of breath. 05/30/20    Ghimire, Werner Lean, MD  amLODipine (NORVASC) 5 MG tablet Take 5 mg by mouth daily. Patient not taking: Reported on 05/27/2020 12/31/19   [provider]  benzonatate (TESSALON) 200 MG capsule Take 1 capsule (200 mg total) by mouth 3 (three) times daily. 05/30/20   Ghimire, Werner Lean, MD  calcium carbonate (TUMS - DOSED IN MG ELEMENTAL CALCIUM) 500 MG chewable tablet Chew 1 tablet by mouth daily.    [provider]  Cholecalciferol (VITAMIN D3) 25 MCG (1000 UT) CAPS Take 1,000 Units by mouth daily.    [provider]  predniSONE (DELTASONE) 10 MG tablet Take 40 mg daily for 1 day, 30 mg daily for 1 day, 20 mg daily for 1 days,10 mg daily for 1 day, then stop 05/30/20   Maretta Bees, MD      Allergies    Penicillins    Review of Systems   Review of Systems  Genitourinary:  Positive for flank pain.    Physical Exam Updated Vital Signs BP (!) 183/88   Pulse 90   Temp 99.4 F (37.4 C) (Oral)   Resp 18   SpO2 97%  Physical Exam Vitals and nursing note reviewed.  Constitutional:      General: She is not in acute distress. HENT:     Head: Normocephalic and atraumatic.     Nose: Nose normal.  Eyes:     General: No scleral icterus. Cardiovascular:     Rate and Rhythm: Normal rate and regular rhythm.  Pulses: Normal pulses.     Heart sounds: Normal heart sounds.  Pulmonary:     Effort: Pulmonary effort is normal. No respiratory distress.     Breath sounds: No wheezing.  Abdominal:     Palpations: Abdomen is soft.     Tenderness: There is abdominal tenderness.     Comments: Mild diffuse abd ttp w deep palpation.  No focal abdominal tenderness.  No guarding or rebound.  There is some mild discomfort with percussion no CVA  Musculoskeletal:     Cervical back: Normal range of motion.     Right lower leg: No edema.     Left lower leg: No edema.  Skin:    General: Skin is warm and dry.     Capillary Refill: Capillary refill takes less than 2 seconds.   Neurological:     Mental Status: She is alert. Mental status is at baseline.  Psychiatric:        Mood and Affect: Mood normal.        Behavior: Behavior normal.     ED Results / Procedures / Treatments   Labs (all labs ordered are listed, but only abnormal results are displayed) Labs Reviewed  COMPREHENSIVE METABOLIC PANEL - Abnormal; Notable for the following components:      Result Value   Glucose, Bld 150 (*)    Creatinine, Ser 1.62 (*)    Calcium 10.4 (*)    GFR, Estimated 34 (*)    All other components within normal limits  CBC - Abnormal; Notable for the following components:   WBC 16.2 (*)    All other components within normal limits  URINALYSIS, ROUTINE W REFLEX MICROSCOPIC - Abnormal; Notable for the following components:   APPearance HAZY (*)    Hgb urine dipstick MODERATE (*)    Protein, ur 100 (*)    Leukocytes,Ua MODERATE (*)    WBC, UA >50 (*)    Bacteria, UA FEW (*)    Non Squamous Epithelial 0-5 (*)    All other components within normal limits  URINE CULTURE    EKG None  Radiology CT Renal Stone Study  Result Date: 04/07/2022 CLINICAL DATA:  Right flank pain. EXAM: CT ABDOMEN AND PELVIS WITHOUT CONTRAST TECHNIQUE: Multidetector CT imaging of the abdomen and pelvis was performed following the standard protocol without IV contrast. RADIATION DOSE REDUCTION: This exam was performed according to the departmental dose-optimization program which includes automated exposure control, adjustment of the mA and/or kV according to patient size and/or use of iterative reconstruction technique. COMPARISON:  CT September 07, 2017. FINDINGS: Lower chest: No acute abnormality. Hepatobiliary: Hepatomegaly with diffuse hepatic steatosis. Gallbladder is distended without focal wall thickening. No biliary ductal dilation. Pancreas: No pancreatic ductal dilation or evidence of acute inflammation. Spleen: No splenomegaly. Adrenals/Urinary Tract: Bilateral adrenal glands appear normal.  Right hydroureteronephrosis to ureterovesicular junction where there are 2 stacked stones measuring up to 4 mm on image 84/3 as well as a stone layering in the urinary bladder or at the UVJ measuring 3 mm on image 86/3. Staghorn type calculus in the right kidney measures 2.2 cm on image 89/6. Additional nonobstructive bilateral renal calculi measure up to 7 mm. Urinary bladder is predominantly decompressed. Stomach/Bowel: Stomach is nondistended limiting evaluation. No pathologic dilation of small or large bowel. The appendix and terminal ileum appear normal. Scattered colonic diverticulosis without findings of acute diverticulitis. Vascular/Lymphatic: Aortic atherosclerosis. No pathologically enlarged abdominal or pelvic lymph nodes. Reproductive: Status post hysterectomy. No adnexal masses. Other:  No significant abdominopelvic free fluid. Musculoskeletal: No acute osseous abnormality. IMPRESSION: 1. Right hydroureteronephrosis to the ureterovesicular junction to 2 stacked stones measuring up to 4 mm as well as a stone layering in the urinary bladder or at the UVJ measuring 3 mm. 2. Staghorn type calculus in the right kidney measures 2.2 cm. Additional nonobstructive bilateral renal calculi measure up to 7 mm. 3. Hepatomegaly with diffuse hepatic steatosis. 4. Scattered colonic diverticulosis without findings of acute diverticulitis. 5.  Aortic Atherosclerosis (ICD10-I70.0). Electronically Signed   By: Maudry Mayhew M.D.   On: 04/07/2022 19:32   DG Abd 1 View  Result Date: 04/07/2022 CLINICAL DATA:  Right-sided abdominal pain EXAM: ABDOMEN - 1 VIEW COMPARISON:  09/07/2017 FINDINGS: The bowel gas pattern is normal. Large staghorn calculus projecting over the right renal shadow measuring approximately 2.8 x 2.6 cm. Several punctate calcifications project over the left renal shadow, largest measuring 0.8 cm. Small phleboliths in the low right pelvis. IMPRESSION: 1. Large staghorn calculus projecting over the  right renal shadow. 2. Punctate left renal calculi. Electronically Signed   By: Duanne Guess D.O.   On: 04/07/2022 14:28    Procedures Procedures    Medications Ordered in ED Medications  fentaNYL (SUBLIMAZE) injection 50 mcg (50 mcg Intravenous Given 04/07/22 2003)  lactated ringers bolus 1,000 mL (0 mLs Intravenous Stopped 04/07/22 2057)  ondansetron (ZOFRAN) injection 4 mg (4 mg Intravenous Given 04/07/22 2003)  acetaminophen (TYLENOL) tablet 650 mg (650 mg Oral Given 04/07/22 2100)  oxyCODONE-acetaminophen (PERCOCET/ROXICET) 5-325 MG per tablet 1 tablet (1 tablet Oral Given 04/07/22 2100)    ED Course/ Medical Decision Making/ A&P Clinical Course as of 04/07/22 2134  Sat Apr 07, 2022  1937 IMPRESSION: 1. Right hydroureteronephrosis to the ureterovesicular junction to 2 stacked stones measuring up to 4 mm as well as a stone layering in the urinary bladder or at the UVJ measuring 3 mm. 2. Staghorn type calculus in the right kidney measures 2.2 cm. Additional nonobstructive bilateral renal calculi measure up to 7 mm. 3. Hepatomegaly with diffuse hepatic steatosis. 4. Scattered colonic diverticulosis without findings of acute diverticulitis. 5.  Aortic Atherosclerosis (ICD10-I70.0). [WF]  2122 Dr. Marlou Porch [WF]    Clinical Course User Index [WF] Gailen Shelter, PA                           Medical Decision Making Amount and/or Complexity of Data Reviewed Labs: ordered. Radiology: ordered.  Risk OTC drugs. Prescription drug management.   This patient presents to the ED for concern of Flank pain, this involves a number of treatment options, and is a complaint that carries with it a moderate to high risk of complications and morbidity. A differential diagnosis was considered for the patient's symptoms which is discussed below:   The differential diagnosis of emergent flank pain includes, but is not limited to :Abdominal aortic aneurysm,, Renal artery embolism,Renal  vein thrombosis, Aortic dissection, Mesenteric ischemia, Pyelonephritis, Renal infarction, Renal hemorrhage, Nephrolithiasis/ Renal Colic, Bladder tumor,Cystitis, Biliary colic, Pancreatitis Perforated peptic ulcer Appendicitis ,Inguinal Hernia, Diverticulitis, Bowel obstruction Ectopic Pregnancy,PID/TOA,Ovarian cyst, Ovarian torsion  Shingles Lower lobe pneumonia, Retroperitoneal hematoma/abscess/tumor, Epidural abscess, Epidural hematoma    Co morbidities: Discussed in HPI   Brief History:  Patient is a 66 year old female with a past medical history significant for a history of 2 kidney stones use apart most recently 2019, hypertension, past surgical history notable for abdominal hysterectomy  She states she is otherwise quite  healthy  She presents emergency room today with complaints of right flank pain that began on Christmas Day but resolved with 1 dose of oral pain medicine.  She endorses numerous episodes of nausea and vomiting today and states that her pain returned a few days ago after complete resolution with 1 dose of Percocet.  She denies any chest pain or difficulty breathing.  She states her abdominal pain seems to be diffuse but certainly persistently painful in the right flank.  No lightheadedness or dizziness.  She denies any fevers.  She states she has had some blood in her urine she denies any dysuria or urinary frequency.  Denies any bowel or bladder incontinence.  Denies any blood in stool.    EMR reviewed including pt PMHx, past surgical history and past visits to ER.   See HPI for more details   Lab Tests:   I ordered and independently interpreted labs. Labs notable for Few bacteria moderate WBCs and leukocytes and hematuria.  Imaging Studies:  Abnormal findings. I personally reviewed all imaging studies. Imaging notable for  IMPRESSION:  1. Right hydroureteronephrosis to the ureterovesicular junction to 2  stacked stones measuring up to 4 mm as well as a  stone layering in  the urinary bladder or at the UVJ measuring 3 mm.  2. Staghorn type calculus in the right kidney measures 2.2 cm.  Additional nonobstructive bilateral renal calculi measure up to 7  mm.  3. Hepatomegaly with diffuse hepatic steatosis.  4. Scattered colonic diverticulosis without findings of acute  diverticulitis.  5.  Aortic Atherosclerosis (ICD10-I70.0).    Cardiac Monitoring:  NA NA   Medicines ordered:  I ordered medication including Tylenol, Percocet, lactated Ringer's, Zofran, fentanyl. For pain Reevaluation of the patient after these medicines showed that the patient improved I have reviewed the patients home medicines and have made adjustments as needed   Critical Interventions:     Consults/Attending Physician   I requested consultation with Dr. Marlou Porch of urology,  and discussed lab and imaging findings as well as pertinent plan - they recommend: abx therapy w hydration, percocet, tamsulosin  I discussed this case with my attending physician who cosigned this note including patient's presenting symptoms, physical exam, and planned diagnostics and interventions. Attending physician stated agreement with plan or made changes to plan which were implemented.    Reevaluation:  After the interventions noted above I re-evaluated patient and found that they have :improved   Social Determinants of Health:      Problem List / ED Course:  Patient with kidney stone this is causing her pain.  Given that she has mild increase in her creatinine in fact specifically a doubling as well as WBC and urine of greater than 50 and a white blood cell count of 16.2 here in the ER moderate leukocytes I did discuss with Dr. Marlou Porch of urology.  I did convey that she looks well and her pain is well-controlled here.  Her vital signs are normal she is not tachycardic, hypotensive, tachypneic or hypoxic.  I discussed admission with her for observation, pain control and  hydration which she declines.  Dr. Marlou Porch thinks that she is reasonable for outpatient follow-up with antibiotics and I will prescribe for this as well as recommend she follow-up ASAP with urology.  I recommend that she call first thing Monday morning.  Dispostion:  After consideration of the diagnostic results and the patients response to treatment, I feel that the patent would benefit from close outpatient follow-up.  Final Clinical Impression(s) / ED Diagnoses Final diagnoses:  Ureterolithiasis    Rx / DC Orders ED Discharge Orders          Ordered    tamsulosin (FLOMAX) 0.4 MG CAPS capsule  Daily after breakfast        04/07/22 2124    nitrofurantoin, macrocrystal-monohydrate, (MACROBID) 100 MG capsule  2 times daily        04/07/22 2124    oxyCODONE-acetaminophen (PERCOCET/ROXICET) 5-325 MG tablet  Every 6 hours PRN        04/07/22 2125              Gailen Shelter, [place] 04/07/22 2253    Gailen Shelter, PA 04/07/22 2253    Virgina Norfolk, DO 04/07/22 2331

## 2022-04-07 NOTE — Discharge Instructions (Addendum)
You have a 2.8 cm staghorn calculus in your right kidney which is likely the cause of your acute right flank pain. Given its size, and the history of hydronephrosis in the past with much smaller ones, I would recommend an ER evaluation to determine the appropriate management. Please head to the emergency department for further evaluation and workup.

## 2022-04-07 NOTE — ED Notes (Signed)
Patient is being discharged from the Urgent Care and sent to the Emergency Department via CAR . Per PA [facility] CRAIN, patient is in need of higher level of care due to FURTHER EVALUATION. Patient is aware and verbalizes understanding of plan of care.  Vitals:   04/07/22 1330  BP: 91/60  Pulse: 95  Resp: 16  SpO2: 96%

## 2022-04-09 LAB — URINE CULTURE

## 2022-04-11 DIAGNOSIS — R8271 Bacteriuria: Secondary | ICD-10-CM | POA: Diagnosis not present

## 2022-04-11 DIAGNOSIS — N202 Calculus of kidney with calculus of ureter: Secondary | ICD-10-CM | POA: Diagnosis not present

## 2022-04-18 ENCOUNTER — Other Ambulatory Visit: Payer: Self-pay | Admitting: Urology

## 2022-04-27 NOTE — Progress Notes (Addendum)
COVID Vaccine Completed:  No  Date of COVID positive in last 90 days:  No  PCP - Jonathon Jordan, MD Cardiologist - N/A  Chest x-ray - N/A EKG - 04-30-22 Epic Stress Test - N/A ECHO - N/A Cardiac Cath - N/A Pacemaker/ICD device last checked: Spinal Cord Stimulator:N/A  Bowel Prep - N/A  Sleep Study -  CPAP - N/A  Fasting Blood Sugar - N/A Checks Blood Sugar _____ times a day  Last dose of GLP1 agonist-  N/A GLP1 instructions:  N/A   Last dose of SGLT-2 inhibitors-  N/A SGLT-2 instructions: N/A  Blood Thinner Instructions:N/A Aspirin Instructions: Last Dose:  Activity level:  Can go up a flight of stairs and perform activities of daily living without stopping and without symptoms of chest pain or shortness of breath.  Able to exercise without symptoms  Anesthesia review:  Creatinine 1.62 on labs at ER   Patient denies shortness of breath, fever, cough and chest pain at PAT appointment  Patient verbalized understanding of instructions that were given to them at the PAT appointment. Patient was also instructed that they will need to review over the PAT instructions again at home before surgery.

## 2022-04-27 NOTE — Patient Instructions (Addendum)
SURGICAL WAITING ROOM VISITATION Patients having surgery or a procedure may have no more than 2 support people in the waiting area - these visitors may rotate.    If the patient needs to stay at the hospital during part of their recovery, the visitor guidelines for inpatient rooms apply. Pre-op nurse will coordinate an appropriate time for 1 support person to accompany patient in pre-op.  This support person may not rotate.    Please refer to the [facility] website for the visitor guidelines for Inpatients (after your surgery is over and you are in a regular room).   Due to an increase in RSV and influenza rates and associated hospitalizations, children ages 42 and under may not visit patients in [place].     Your procedure is scheduled on: 05-01-22   Report to [facility] Main Entrance    Report to admitting at 12:45 PM   Call this number if you have problems the morning of surgery [phone]   Do not eat food or drink liquids :After Midnight.          If you have questions, please contact your surgeon's office.   FOLLOW ANY ADDITIONAL PRE OP INSTRUCTIONS YOU RECEIVED FROM YOUR SURGEON'S OFFICE!!!     Oral Hygiene is also important to reduce your risk of infection.                                    Remember - BRUSH YOUR TEETH THE MORNING OF SURGERY WITH YOUR REGULAR TOOTHPASTE   Do NOT smoke after Midnight   Take these medicines the morning of surgery with A SIP OF WATER:   Claritin  Tamsulosin                              You may not have any metal on your body including hair pins, jewelry, and body piercing             Do not wear make-up, lotions, powders, perfumes or deodorant  Do not wear nail polish including gel and S&S, artificial/acrylic nails, or any other type of covering on natural nails including finger and toenails. If you have artificial nails, gel coating, etc. that needs to be removed by a nail salon please have this removed prior to  surgery or surgery may need to be canceled/ delayed if the surgeon/ anesthesia feels like they are unable to be safely monitored.   Do not shave  48 hours prior to surgery.       Do not bring valuables to the hospital. [place].   Contacts, dentures or bridgework may not be worn into surgery.  DO NOT [place]. PHARMACY WILL DISPENSE MEDICATIONS LISTED ON YOUR MEDICATION LIST TO YOU DURING YOUR ADMISSION [place]!    Patients discharged on the day of surgery will not be allowed to drive home.  Someone NEEDS to stay with you for the first 24 hours after anesthesia.              Please read over the following fact sheets you were given: IF YOU HAVE QUESTIONS ABOUT YOUR PRE-OP INSTRUCTIONS PLEASE CALL [place]  If you received a COVID test during your pre-op visit  it is requested that you wear a mask when out in  public, stay away from anyone that may not be feeling well and notify your surgeon if you develop symptoms. If you test positive for Covid or have been in contact with anyone that has tested positive in the last 10 days please notify you surgeon.  [place] - Preparing for Surgery Before surgery, you can play an important role.  Because skin is not sterile, your skin needs to be as free of germs as possible.  You can reduce the number of germs on your skin by washing with CHG (chlorahexidine gluconate) soap before surgery.  CHG is an antiseptic cleaner which kills germs and bonds with the skin to continue killing germs even after washing. Please DO NOT use if you have an allergy to CHG or antibacterial soaps.  If your skin becomes reddened/irritated stop using the CHG and inform your nurse when you arrive at Short Stay. Do not shave (including legs and underarms) for at least 48 hours prior to the first CHG shower.  You may shave your face/neck.  Please follow these instructions carefully:  1.   Shower with CHG Soap the night before surgery and the  morning of surgery.  2.  If you choose to wash your hair, wash your hair first as usual with your normal  shampoo.  3.  After you shampoo, rinse your hair and body thoroughly to remove the shampoo.                             4.  Use CHG as you would any other liquid soap.  You can apply chg directly to the skin and wash.  Gently with a scrungie or clean washcloth.  5.  Apply the CHG Soap to your body ONLY FROM THE NECK DOWN.   Do   not use on face/ open                           Wound or open sores. Avoid contact with eyes, ears mouth and   genitals (private parts).                       Wash face,  Genitals (private parts) with your normal soap.             6.  Wash thoroughly, paying special attention to the area where your    surgery  will be performed.  7.  Thoroughly rinse your body with warm water from the neck down.  8.  DO NOT shower/wash with your normal soap after using and rinsing off the CHG Soap.                9.  Pat yourself dry with a clean towel.            10.  Wear clean pajamas.            11.  Place clean sheets on your bed the night of your first shower and do not  sleep with pets. Day of Surgery : Do not apply any lotions/deodorants the morning of surgery.  Please wear clean clothes to the hospital/surgery center.  FAILURE TO FOLLOW THESE INSTRUCTIONS MAY RESULT IN THE CANCELLATION OF YOUR SURGERY  PATIENT SIGNATURE_________________________________  NURSE SIGNATURE__________________________________  ________________________________________________________________________

## 2022-04-29 NOTE — H&P (Signed)
CC/HPI: cc: urolithiasis   04/11/22: 67 yo woman with hx of urolithiasis presented to ED with flank pain found to have 3 mm right UVJ calculus and bilateral non-obstructing renal calculi. Her pain is controlled with oral pain medication. She has had 2 kidney stone episodes in the past but passed them without any kind of intervention. She does not currently have fevers or chills     ALLERGIES: Penicillin    MEDICATIONS: None   GU PSH: Hysterectomy     NON-GU PSH: None   GU PMH: None     PMH Notes: Kidney stones.   NON-GU PMH: Hypercholesterolemia Hypertension    FAMILY HISTORY: No Family History    SOCIAL HISTORY: Marital Status: Married Preferred Language: English; Ethnicity: Not Hispanic Or Latino; Race: White Current Smoking Status: Patient has never smoked.   Tobacco Use Assessment Completed: Used Tobacco in last 30 days? Has never drank.  Drinks 1 caffeinated drink per day.    REVIEW OF SYSTEMS:    GU Review Female:   Patient reports frequent urination. Patient denies hard to postpone urination, burning /pain with urination, get up at night to urinate, leakage of urine, stream starts and stops, trouble starting your stream, have to strain to urinate, and being pregnant.  Gastrointestinal (Upper):   Patient reports nausea and vomiting. Patient denies indigestion/ heartburn.  Gastrointestinal (Lower):   Patient denies diarrhea and constipation.  Constitutional:   Patient denies fever, night sweats, weight loss, and fatigue.  Skin:   Patient denies skin rash/ lesion and itching.  Eyes:   Patient denies blurred vision and double vision.  Ears/ Nose/ Throat:   Patient denies sore throat and sinus problems.  Hematologic/Lymphatic:   Patient denies swollen glands and easy bruising.  Cardiovascular:   Patient denies leg swelling and chest pains.  Respiratory:   Patient denies cough and shortness of breath.  Endocrine:   Patient denies excessive thirst.  Musculoskeletal:    Patient denies back pain and joint pain.  Neurological:   Patient reports headaches. Patient denies dizziness.  Psychologic:   Patient denies depression and anxiety.   VITAL SIGNS:      04/11/2022 10:22 AM  Weight 153 lb / 69.4 kg  Height 61 in / 154.94 cm  BP 127/84 mmHg  Pulse 109 /min  Temperature 98.2 F / 36.7 C  BMI 28.9 kg/m   MULTI-SYSTEM PHYSICAL EXAMINATION:    Constitutional: Well-nourished. No physical deformities. Normally developed. Good grooming.  Neck: Neck symmetrical, not swollen. Normal tracheal position.  Respiratory: No labored breathing, no use of accessory muscles.   Skin: No paleness, no jaundice, no cyanosis. No lesion, no ulcer, no rash.  Neurologic / Psychiatric: Oriented to time, oriented to place, oriented to person. No depression, no anxiety, no agitation.  Eyes: Normal conjunctivae. Normal eyelids.  Ears, Nose, Mouth, and Throat: Left ear no scars, no lesions, no masses. Right ear no scars, no lesions, no masses. Nose no scars, no lesions, no masses. Normal hearing. Normal lips.  Musculoskeletal: Normal gait and station of head and neck.     Complexity of Data:  Records Review:   Previous Hospital Records, Previous Patient Records, POC Tool  Urine Test Review:   Urinalysis  X-Ray Review: C.T. Abdomen/Pelvis: Reviewed Films. Reviewed Report. Discussed With Patient.  IMPRESSION:  1. Right hydroureteronephrosis to the ureterovesicular junction to 2  stacked stones measuring up to 4 mm as well as a stone layering in  the urinary bladder or at the UVJ measuring 3  mm.  2. Staghorn type calculus in the right kidney measures 2.2 cm.  Additional nonobstructive bilateral renal calculi measure up to 7  mm.  3. Hepatomegaly with diffuse hepatic steatosis.  4. Scattered colonic diverticulosis without findings of acute  diverticulitis.  5. Aortic Atherosclerosis (ICD10-I70.0).    Electronically Signed  By: Dahlia Bailiff M.D.  On: 04/07/2022 19:32     PROCEDURES:          Urinalysis w/Scope Dipstick Dipstick Cont'd Micro  Color: Yellow Bilirubin: Neg mg/dL WBC/hpf: 6 - 10/hpf  Appearance: Slightly Cloudy Ketones: Trace mg/dL RBC/hpf: 0 - 2/hpf  Specific Gravity: 1.025 Blood: 2+ ery/uL Bacteria: Mod (26-50/hpf)  pH: 6.0 Protein: 1+ mg/dL Cystals: NS (Not Seen)  Glucose: Neg mg/dL Urobilinogen: 0.2 mg/dL Casts: NS (Not Seen)    Nitrites: Neg Trichomonas: Not Present    Leukocyte Esterase: 1+ leu/uL Mucous: Present      Epithelial Cells: 6 - 10/hpf      Yeast: NS (Not Seen)      Sperm: Not Present    ASSESSMENT:      ICD-10 Details  1 GU:   Renal calculus - N20.0 Chronic, Stable  2   Ureteral calculus - H20.9 Acute, Uncomplicated   PLAN:            Medications New Meds: Tamsulosin Hcl 0.4 mg capsule 1 capsule PO Q HS   #30  3 Refill(s)  Bactrim 400 mg-80 mg tablet 1 tablet PO BID take 3 days leading up to surgery date  #12  0 Refill(s)  Oxycodone Hcl 5 mg tablet 1 tablet PO Q 6 H PRN severe kidney stone pain  #20  0 Refill(s)  Pharmacy Name:  [id number]  Address:  [place], [place] [address]  Phone:  [phone]  Fax:  [phone]            Orders Labs CULTURE, URINE          Document Letter(s):  Created for Patient: Clinical Summary         Notes:   1. Urolithiasis:  -Patient has significant right renal calculi as well as left renal stone burden  -We discussed proceeding with bilateral ureteroscopy with laser lithotripsy and stent placement. Risks and benefits of the procedure were discussed the patient in detail including but not limited to pain, bleeding, infection, damage surrounding structures, need for stent, stent discomfort. Patient understands and wishes to proceed  -Will start antibiotic 3 days leading up to procedure for UTI prevention

## 2022-04-30 ENCOUNTER — Other Ambulatory Visit: Payer: Self-pay

## 2022-04-30 ENCOUNTER — Encounter (HOSPITAL_COMMUNITY)
Admission: RE | Admit: 2022-04-30 | Discharge: 2022-04-30 | Disposition: A | Discharge status: Home / Self Care | Payer: Medicare HMO | Source: Ambulatory Visit | Attending: Urology | Admitting: Urology

## 2022-04-30 ENCOUNTER — Encounter (HOSPITAL_COMMUNITY): Payer: Self-pay

## 2022-04-30 VITALS — BP 140/98 | HR 88 | Temp 98.0°F | Resp 20 | Ht 61.0 in | Wt 159.2 lb

## 2022-04-30 DIAGNOSIS — I2510 Atherosclerotic heart disease of native coronary artery without angina pectoris: Secondary | ICD-10-CM | POA: Diagnosis not present

## 2022-04-30 DIAGNOSIS — Z01810 Encounter for preprocedural cardiovascular examination: Secondary | ICD-10-CM | POA: Insufficient documentation

## 2022-04-30 HISTORY — DX: Personal history of urinary calculi: Z87.442

## 2022-04-30 HISTORY — DX: Gastro-esophageal reflux disease without esophagitis: K21.9

## 2022-04-30 HISTORY — DX: Pneumonia, unspecified organism: J18.9

## 2022-04-30 NOTE — Discharge Instructions (Addendum)
DISCHARGE INSTRUCTIONS FOR KIDNEY STONE/URETERAL STENT   MEDICATIONS:  1. Resume all your other meds from home  2. AZO over the counter can help with the burning/stinging when you urinate. 3. Tramadol is for moderate/severe pain, otherwise taking up to 1000 mg every 6 hours of plain Tylenol will help treat your pain.   4. Tamsulosin can help with stent discomfort. 5.  Oxybutynin can help with overactive bladder symptoms while the stents are in place.   ACTIVITY:  1. No strenuous activity x 1week  2. No driving while on narcotic pain medications  3. Drink plenty of water  4. Continue to walk at home - you can still get blood clots when you are at home, so keep active, but don't over do it.  5. May return to work/school tomorrow or when you feel ready   BATHING:  1. You can shower and we recommend daily showers    SIGNS/SYMPTOMS TO CALL:  Please call us if you have a fever greater than 101.5, uncontrolled nausea/vomiting, uncontrolled pain, dizziness, unable to urinate, bloody urine, chest pain, shortness of breath, leg swelling, leg pain, redness around wound, drainage from wound, or any other concerns or questions.   You can reach [place] at [phone].   FOLLOW-UP:  1.  We will get a KUB at your follow-up to see if a second look procedure is necessary.

## 2022-05-01 ENCOUNTER — Encounter (HOSPITAL_COMMUNITY)
Admission: RE | Disposition: A | Discharge status: Home / Self Care | Payer: Self-pay | Source: Home / Self Care | Attending: Urology

## 2022-05-01 ENCOUNTER — Encounter (HOSPITAL_COMMUNITY): Payer: Self-pay | Admitting: Urology

## 2022-05-01 ENCOUNTER — Ambulatory Visit (HOSPITAL_COMMUNITY)
Admission: RE | Admit: 2022-05-01 | Discharge: 2022-05-01 | Disposition: A | Discharge status: Home / Self Care | Payer: Medicare HMO | Attending: Urology | Admitting: Urology

## 2022-05-01 ENCOUNTER — Ambulatory Visit (HOSPITAL_COMMUNITY): Payer: Medicare HMO

## 2022-05-01 ENCOUNTER — Ambulatory Visit (HOSPITAL_COMMUNITY): Payer: Medicare HMO | Admitting: Anesthesiology

## 2022-05-01 ENCOUNTER — Ambulatory Visit (HOSPITAL_BASED_OUTPATIENT_CLINIC_OR_DEPARTMENT_OTHER): Payer: Medicare HMO | Admitting: Anesthesiology

## 2022-05-01 DIAGNOSIS — N200 Calculus of kidney: Secondary | ICD-10-CM | POA: Diagnosis not present

## 2022-05-01 DIAGNOSIS — N132 Hydronephrosis with renal and ureteral calculous obstruction: Secondary | ICD-10-CM | POA: Insufficient documentation

## 2022-05-01 DIAGNOSIS — I10 Essential (primary) hypertension: Secondary | ICD-10-CM | POA: Diagnosis not present

## 2022-05-01 DIAGNOSIS — Z79899 Other long term (current) drug therapy: Secondary | ICD-10-CM | POA: Insufficient documentation

## 2022-05-01 HISTORY — PX: HOLMIUM LASER APPLICATION: SHX5852

## 2022-05-01 HISTORY — PX: CYSTOSCOPY WITH RETROGRADE PYELOGRAM, URETEROSCOPY AND STENT PLACEMENT: SHX5789

## 2022-05-01 SURGERY — CYSTOURETEROSCOPY, WITH RETROGRADE PYELOGRAM AND STENT INSERTION
Anesthesia: General | Laterality: Bilateral

## 2022-05-01 MED ORDER — CIPROFLOXACIN IN D5W 400 MG/200ML IV SOLN
400.0000 mg | INTRAVENOUS | Status: AC
  Administered 2022-05-01: 400 mg via INTRAVENOUS
  Filled 2022-05-01: qty 200

## 2022-05-01 MED ORDER — IOHEXOL 300 MG/ML  SOLN
INTRAMUSCULAR | Status: DC | PRN
  Administered 2022-05-01: 25 mL

## 2022-05-01 MED ORDER — AMISULPRIDE (ANTIEMETIC) 5 MG/2ML IV SOLN: 10.0000 mg | Freq: Once | INTRAVENOUS | Status: DC | PRN

## 2022-05-01 MED ORDER — SCOPOLAMINE 1 MG/3DAYS TD PT72: 1.0000 | MEDICATED_PATCH | TRANSDERMAL | Status: DC

## 2022-05-01 MED ORDER — TRAMADOL HCL 50 MG PO TABS: 50.0000 mg | ORAL_TABLET | Freq: Four times a day (QID) | ORAL | 0 refills | Status: AC | PRN

## 2022-05-01 MED ORDER — SCOPOLAMINE 1 MG/3DAYS TD PT72
1.0000 | MEDICATED_PATCH | Freq: Once | TRANSDERMAL | Status: DC
  Administered 2022-05-01: 1.5 mg via TRANSDERMAL
  Filled 2022-05-01: qty 1

## 2022-05-01 MED ORDER — FENTANYL CITRATE PF 50 MCG/ML IJ SOSY: 25.0000 ug | PREFILLED_SYRINGE | INTRAMUSCULAR | Status: DC | PRN

## 2022-05-01 MED ORDER — ACETAMINOPHEN 500 MG PO TABS
1000.0000 mg | ORAL_TABLET | Freq: Once | ORAL | Status: AC
  Administered 2022-05-01: 1000 mg via ORAL
  Filled 2022-05-01: qty 2

## 2022-05-01 MED ORDER — SODIUM CHLORIDE 0.9 % IR SOLN
Status: DC | PRN
  Administered 2022-05-01: 6000 mL

## 2022-05-01 MED ORDER — CHLORHEXIDINE GLUCONATE 0.12 % MT SOLN: 15.0000 mL | Freq: Once | OROMUCOSAL | Status: DC

## 2022-05-01 MED ORDER — FENTANYL CITRATE (PF) 100 MCG/2ML IJ SOLN
INTRAMUSCULAR | Status: AC
  Filled 2022-05-01: qty 2

## 2022-05-01 MED ORDER — ORAL CARE MOUTH RINSE: 15.0000 mL | Freq: Once | OROMUCOSAL | Status: DC

## 2022-05-01 MED ORDER — FENTANYL CITRATE PF 50 MCG/ML IJ SOSY
PREFILLED_SYRINGE | INTRAMUSCULAR | Status: AC
  Filled 2022-05-01: qty 1

## 2022-05-01 MED ORDER — FENTANYL CITRATE (PF) 100 MCG/2ML IJ SOLN
INTRAMUSCULAR | Status: DC | PRN
  Administered 2022-05-01 (×2): 50 ug via INTRAVENOUS

## 2022-05-01 MED ORDER — LACTATED RINGERS IV SOLN: INTRAVENOUS | Status: DC

## 2022-05-01 MED ORDER — LIDOCAINE HCL (PF) 2 % IJ SOLN
INTRAMUSCULAR | Status: AC
  Filled 2022-05-01: qty 5

## 2022-05-01 MED ORDER — PROMETHAZINE HCL 25 MG/ML IJ SOLN: 6.2500 mg | INTRAMUSCULAR | Status: DC | PRN

## 2022-05-01 MED ORDER — DEXAMETHASONE SODIUM PHOSPHATE 10 MG/ML IJ SOLN
INTRAMUSCULAR | Status: AC
  Filled 2022-05-01: qty 1

## 2022-05-01 MED ORDER — PROPOFOL 10 MG/ML IV BOLUS
INTRAVENOUS | Status: DC | PRN
  Administered 2022-05-01: 170 mg via INTRAVENOUS

## 2022-05-01 MED ORDER — PROPOFOL 10 MG/ML IV BOLUS
INTRAVENOUS | Status: AC
  Filled 2022-05-01: qty 20

## 2022-05-01 MED ORDER — TAMSULOSIN HCL 0.4 MG PO CAPS: 0.4000 mg | ORAL_CAPSULE | Freq: Every day | ORAL | 0 refills | Status: AC

## 2022-05-01 MED ORDER — DEXAMETHASONE SODIUM PHOSPHATE 10 MG/ML IJ SOLN
INTRAMUSCULAR | Status: DC | PRN
  Administered 2022-05-01: 4 mg via INTRAVENOUS

## 2022-05-01 MED ORDER — OXYBUTYNIN CHLORIDE ER 10 MG PO TB24: 10.0000 mg | ORAL_TABLET | Freq: Every day | ORAL | 0 refills | Status: AC

## 2022-05-01 MED ORDER — ONDANSETRON HCL 4 MG/2ML IJ SOLN
INTRAMUSCULAR | Status: AC
  Filled 2022-05-01: qty 2

## 2022-05-01 MED ORDER — ONDANSETRON HCL 4 MG/2ML IJ SOLN
INTRAMUSCULAR | Status: DC | PRN
  Administered 2022-05-01: 4 mg via INTRAVENOUS

## 2022-05-01 MED ORDER — LIDOCAINE 2% (20 MG/ML) 5 ML SYRINGE
INTRAMUSCULAR | Status: DC | PRN
  Administered 2022-05-01: 100 mg via INTRAVENOUS

## 2022-05-01 SURGICAL SUPPLY — 22 items
BAG URO CATCHER STRL LF (MISCELLANEOUS) ×1 IMPLANT
BASKET ZERO TIP NITINOL 2.4FR (BASKET) IMPLANT
CATH URETL OPEN 5X70 (CATHETERS) ×1 IMPLANT
CLOTH BEACON ORANGE TIMEOUT ST (SAFETY) ×1 IMPLANT
DRSG TEGADERM 2-3/8X2-3/4 SM (GAUZE/BANDAGES/DRESSINGS) IMPLANT
EXTRACTOR STONE 1.7FRX115CM (UROLOGICAL SUPPLIES) IMPLANT
FIBER LASER MOSES 200 DFL (Laser) IMPLANT
GLOVE BIO SURGEON STRL SZ 6.5 (GLOVE) ×1 IMPLANT
GOWN STRL REUS W/ TWL LRG LVL3 (GOWN DISPOSABLE) ×1 IMPLANT
GOWN STRL REUS W/TWL LRG LVL3 (GOWN DISPOSABLE) ×1
GUIDEWIRE STR DUAL SENSOR (WIRE) ×1 IMPLANT
KIT TURNOVER KIT A (KITS) IMPLANT
LASER FIB FLEXIVA PULSE ID 365 (Laser) IMPLANT
MANIFOLD NEPTUNE II (INSTRUMENTS) ×1 IMPLANT
PACK CYSTO (CUSTOM PROCEDURE TRAY) ×1 IMPLANT
SHEATH NAVIGATOR HD 11/13X28 (SHEATH) IMPLANT
SHEATH NAVIGATOR HD 11/13X36 (SHEATH) IMPLANT
STENT URET 6FRX24 CONTOUR (STENTS) IMPLANT
TRACTIP FLEXIVA PULS ID 200XHI (Laser) IMPLANT
TRACTIP FLEXIVA PULSE ID 200 (Laser) ×1
TUBING CONNECTING 10 (TUBING) ×1 IMPLANT
TUBING UROLOGY SET (TUBING) ×1 IMPLANT

## 2022-05-01 NOTE — Anesthesia Preprocedure Evaluation (Addendum)
Anesthesia Evaluation  Patient identified by MRN, date of birth, ID band Patient awake    Reviewed: Allergy & Precautions, NPO status , Patient's Chart, lab work & pertinent test results  History of Anesthesia Complications Negative for: history of anesthetic complications  Airway Mallampati: II  TM Distance: >3 FB Neck ROM: Full    Dental no notable dental hx. (+) Dental Advisory Given   Pulmonary neg pulmonary ROS   Pulmonary exam normal        Cardiovascular hypertension, Pt. on medications Normal cardiovascular exam     Neuro/Psych negative neurological ROS     GI/Hepatic Neg liver ROS,GERD  ,,  Endo/Other  negative endocrine ROS    Renal/GU      Musculoskeletal negative musculoskeletal ROS (+)    Abdominal   Peds  Hematology negative hematology ROS (+)   Anesthesia Other Findings   Reproductive/Obstetrics                             Anesthesia Physical Anesthesia Plan  ASA: 2  Anesthesia Plan: General   Post-op Pain Management: Tylenol PO (pre-op)* and Toradol IV (intra-op)*   Induction: Intravenous  PONV Risk Score and Plan: 4 or greater and Ondansetron, Dexamethasone, Midazolam and Scopolamine patch - Pre-op  Airway Management Planned: LMA  Additional Equipment:   Intra-op Plan:   Post-operative Plan: Extubation in OR  Informed Consent: I have reviewed the patients History and Physical, chart, labs and discussed the procedure including the risks, benefits and alternatives for the proposed anesthesia with the patient or authorized representative who has indicated his/her understanding and acceptance.     Dental advisory given  Plan Discussed with: Anesthesiologist and CRNA  Anesthesia Plan Comments:        Anesthesia Quick Evaluation

## 2022-05-01 NOTE — Interval H&P Note (Signed)
History and Physical Interval Note:  05/01/2022 1:30 PM  Melissa Hobbs  has presented today for surgery, with the diagnosis of BILATERAL RENAL CALCULI.  The various methods of treatment have been discussed with the patient and family. After consideration of risks, benefits and other options for treatment, the patient has consented to  Procedure(s) with comments: CYSTOSCOPY WITH RETROGRADE PYELOGRAM, URETEROSCOPY AND STENT PLACEMENT (Bilateral) - 42 MINS MOSES LASER APPLICATION (Bilateral) as a surgical intervention.  The patient's history has been reviewed, patient examined, no change in status, stable for surgery.  I have reviewed the patient's chart and labs.  Questions were answered to the patient's satisfaction.     [name]

## 2022-05-01 NOTE — OR Nursing (Signed)
Stone sent home with patient

## 2022-05-01 NOTE — Transfer of Care (Signed)
Immediate Anesthesia Transfer of Care Note  Patient: Melissa Hobbs  Procedure(s) Performed: CYSTOSCOPY WITH RETROGRADE PYELOGRAM, URETEROSCOPY AND STENT PLACEMENT (Bilateral) MOSES LASER APPLICATION (Bilateral)  Patient Location: PACU  Anesthesia Type:General  Level of Consciousness: awake, alert , oriented, and patient cooperative  Airway & Oxygen Therapy: Patient Spontanous Breathing and Patient connected to nasal cannula oxygen  Post-op Assessment: Report given to RN, Post -op Vital signs reviewed and stable, and Patient moving all extremities X 4  Post vital signs: Reviewed and stable  Last Vitals:  Vitals Value Taken Time  BP 145/82 05/01/22 1539  Temp    Pulse 79 05/01/22 1540  Resp 13 05/01/22 1540  SpO2 94 % 05/01/22 1540  Vitals shown include unvalidated device data.  Last Pain:  Vitals:   05/01/22 1242  TempSrc:   PainSc: 0-No pain      Patients Stated Pain Goal: 4 (24/23/53 6144)  Complications: No notable events documented.

## 2022-05-01 NOTE — Op Note (Signed)
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  The more medial UO went to the superior moiety and the lateral UO went to the more inferior moiety.  Very prominent papilla on the left. Bladder mucosa normal without masses   EBL: Minimal  Specimens: Renal calculus  Disposition of specimens: [facility] for stone analysis  Indication: NICO Melissa Hobbs is a 67 y.o.   patient with a 3 mm right UVJ calculus as well as bilateral nonobstructing renal calculi.  After reviewing the management options for treatment, the patient elected to proceed with the above surgical procedure(s). We have discussed the potential benefits and risks of the procedure, side effects of the proposed treatment, the likelihood of the patient achieving the goals of the procedure, and any potential problems that might occur during the procedure or recuperation. Informed consent has been obtained.   Description of procedure:  The patient was taken to the operating room and general anesthesia was induced.  The patient was placed in the dorsal lithotomy position, prepped and draped in the usual sterile fashion, and preoperative antibiotics were administered. A  preoperative time-out was performed.   Cystourethroscopy was performed.  The patient's urethra was examined and was normal. There was no evidence for any bladder tumors, stones, or other mucosal pathology.    Attention then turned to the right ureteral orifice and a ureteral catheter was used to intubate the ureteral orifice.  Omnipaque contrast was injected through the ureteral catheter and a retrograde pyelogram was performed with findings as dictated above.  A 0.38 sensor guidewire was then advanced up the right ureter into the renal pelvis under fluoroscopic guidance.  A second sensor wire was placed alongside the first sensor wire into the kidney with fluoroscopic guidance.  A ureteral access sheath was then placed over one of the wires and advanced to the proximal ureter with fluoroscopic guidance.  The inner sheath and wire removed.  The other wire was secured as a safety wire.  Next flexible ureteroscopy took place and a large calculus was seen in the renal pelvis.  The stone was then fragmented with a 242 holmium micron laser fiber.   Once the stone was fragmented basket stone extraction took place with a 0 tip basket.  The stone was very hard and 3 laser was were needed to appropriately fragment the stone.  Basketing took place until visualization was poor.  There were remaining stone fragments in the kidney under direct visualization as well as seen on fluoroscopy.  The decision was made to come back for second look procedure.  The ureteral access sheath was removed and unison with the ureteroscope taking care examine the ureter on the way out.  There is no injury or trauma noted to the ureter and no residual ureteral stone fragments.   The wire was then backloaded through the cystoscope and a ureteral stent was advance over the wire using Seldinger technique.  The stent was positioned appropriately under fluoroscopic and cystoscopic guidance.  The wire was then removed with an adequate stent  curl noted in the renal pelvis as well as in the bladder.  Attention then turned to the left ureteral orifices.  Retrograde pyelograms were obtained through the both UOs which showed complete duplicated system.  There was no confluence of ureter is noted.  The more lateral ureteral orifice was then intubated with a sensor wire and advanced to the kidney with fluoroscopic guidance.  A second sensor wire was placed in similar manner.  Again a ureteral access sheath was placed over one of the wires and advanced the proximal ureter with fluoroscopic guidance.  The inner sheath and wire removed.  Flexible ureteroscopy took place.  There were some smaller stones that were noted in the upper and lower poles.  The holmium laser fiber was used to dust the stones.  Basketing was not necessary due to the size of the remaining fragments.  There was prominent papilla noted with retrograde pyelogram and confirmed under direct visualization.  As the plan was to go back for second look I did not advance ureteroscope through the more medial left ureteral orifice.  The ureteral access sheath was removed and unison with the ureteroscope taking care examine the ureter on the way out.  Again no trauma was noted to the lateral ureteral orifice and ureter.  A stent was placed in a similar manner.    The bladder was then emptied and the procedure ended.  The patient appeared to tolerate the procedure well and without complications.  The patient was able to be awakened and transferred to the recovery unit in satisfactory condition.   Disposition: The patient will have a postop check in 1 week and a KUB to be obtained at this time.  Discussed second look ureteroscopy.

## 2022-05-01 NOTE — Anesthesia Postprocedure Evaluation (Signed)
Anesthesia Post Note  Patient: Melissa Hobbs  Procedure(s) Performed: CYSTOSCOPY WITH RETROGRADE PYELOGRAM, URETEROSCOPY AND STENT PLACEMENT (Bilateral) MOSES LASER APPLICATION (Bilateral)     Patient location during evaluation: PACU Anesthesia Type: General Level of consciousness: sedated Pain management: pain level controlled Vital Signs Assessment: post-procedure vital signs reviewed and stable Respiratory status: spontaneous breathing and respiratory function stable Cardiovascular status: stable Postop Assessment: no apparent nausea or vomiting Anesthetic complications: no   No notable events documented.  Last Vitals:  Vitals:   05/01/22 1600 05/01/22 1615  BP: (!) 144/76 133/73  Pulse: 79 73  Resp: 16 10  Temp:    SpO2: 96% 92%    Last Pain:  Vitals:   05/01/22 1615  TempSrc:   PainSc: 3                  [name] DANIEL

## 2022-05-02 ENCOUNTER — Encounter (HOSPITAL_COMMUNITY): Payer: Self-pay | Admitting: Urology

## 2022-05-09 DIAGNOSIS — N202 Calculus of kidney with calculus of ureter: Secondary | ICD-10-CM | POA: Diagnosis not present

## 2022-05-18 ENCOUNTER — Other Ambulatory Visit: Payer: Self-pay | Admitting: Urology

## 2022-05-18 DIAGNOSIS — N202 Calculus of kidney with calculus of ureter: Secondary | ICD-10-CM | POA: Diagnosis not present

## 2022-05-21 ENCOUNTER — Encounter (HOSPITAL_BASED_OUTPATIENT_CLINIC_OR_DEPARTMENT_OTHER): Payer: Self-pay | Admitting: Urology

## 2022-05-21 NOTE — Progress Notes (Signed)
Spoke w/ via phone for pre-op interview--- pt Lab needs dos---- [employer] results------ current EKG in epic/ chart COVID test -----patient states asymptomatic no test needed Arrive at -------  1015 on 05-23-2022 NPO after MN NO Solid Food.  Clear liquids from MN until--- 0915 Med rec completed Medications to take morning of surgery ----- none Diabetic medication ----- n/a Patient instructed no nail polish to be worn day of surgery Patient instructed to bring photo id and insurance card day of surgery Patient aware to have Driver (ride ) / caregiver    for 24 hours after surgery -- husband, paul Patient Special Instructions ----- n/a Pre-Op special Istructions ----- n/a Patient verbalized understanding of instructions that were given at this phone interview. Patient denies shortness of breath, chest pain, fever, cough at this phone interview.

## 2022-05-23 ENCOUNTER — Ambulatory Visit (HOSPITAL_BASED_OUTPATIENT_CLINIC_OR_DEPARTMENT_OTHER): Payer: Medicare HMO | Admitting: Anesthesiology

## 2022-05-23 ENCOUNTER — Encounter (HOSPITAL_BASED_OUTPATIENT_CLINIC_OR_DEPARTMENT_OTHER): Payer: Self-pay | Admitting: Urology

## 2022-05-23 ENCOUNTER — Encounter (HOSPITAL_BASED_OUTPATIENT_CLINIC_OR_DEPARTMENT_OTHER)
Admission: RE | Disposition: A | Discharge status: Home / Self Care | Payer: Self-pay | Source: Home / Self Care | Attending: Urology

## 2022-05-23 ENCOUNTER — Other Ambulatory Visit: Payer: Self-pay

## 2022-05-23 ENCOUNTER — Ambulatory Visit (HOSPITAL_BASED_OUTPATIENT_CLINIC_OR_DEPARTMENT_OTHER)
Admission: RE | Admit: 2022-05-23 | Discharge: 2022-05-23 | Disposition: A | Discharge status: Home / Self Care | Payer: Medicare HMO | Attending: Urology | Admitting: Urology

## 2022-05-23 DIAGNOSIS — I10 Essential (primary) hypertension: Secondary | ICD-10-CM | POA: Diagnosis not present

## 2022-05-23 DIAGNOSIS — K219 Gastro-esophageal reflux disease without esophagitis: Secondary | ICD-10-CM | POA: Insufficient documentation

## 2022-05-23 DIAGNOSIS — N202 Calculus of kidney with calculus of ureter: Secondary | ICD-10-CM | POA: Diagnosis not present

## 2022-05-23 DIAGNOSIS — N201 Calculus of ureter: Secondary | ICD-10-CM | POA: Diagnosis not present

## 2022-05-23 DIAGNOSIS — Z01818 Encounter for other preprocedural examination: Secondary | ICD-10-CM

## 2022-05-23 HISTORY — DX: Vitamin D deficiency, unspecified: E55.9

## 2022-05-23 HISTORY — PX: CYSTOSCOPY/URETEROSCOPY/HOLMIUM LASER/STENT PLACEMENT: SHX6546

## 2022-05-23 HISTORY — DX: Hyperlipidemia, unspecified: E78.5

## 2022-05-23 HISTORY — DX: Frequency of micturition: R35.0

## 2022-05-23 HISTORY — DX: Calculus of ureter: N20.1

## 2022-05-23 HISTORY — DX: Prediabetes: R73.03

## 2022-05-23 LAB — POCT I-STAT, CHEM 8
BUN: 14 mg/dL (ref 8–23)
Calcium, Ion: 1.26 mmol/L (ref 1.15–1.40)
Chloride: 105 mmol/L (ref 98–111)
Creatinine, Ser: 1.2 mg/dL — ABNORMAL HIGH (ref 0.44–1.00)
Glucose, Bld: 124 mg/dL — ABNORMAL HIGH (ref 70–99)
HCT: 41 % (ref 36.0–46.0)
Hemoglobin: 13.9 g/dL (ref 12.0–15.0)
Potassium: 3.7 mmol/L (ref 3.5–5.1)
Sodium: 143 mmol/L (ref 135–145)
TCO2: 24 mmol/L (ref 22–32)

## 2022-05-23 SURGERY — CYSTOSCOPY/URETEROSCOPY/HOLMIUM LASER/STENT PLACEMENT
Anesthesia: General | Laterality: Right

## 2022-05-23 MED ORDER — IOHEXOL 300 MG/ML  SOLN
INTRAMUSCULAR | Status: DC | PRN
  Administered 2022-05-23: 9 mL via URETHRAL

## 2022-05-23 MED ORDER — FENTANYL CITRATE (PF) 100 MCG/2ML IJ SOLN: 25.0000 ug | INTRAMUSCULAR | Status: DC | PRN

## 2022-05-23 MED ORDER — OXYCODONE HCL 5 MG PO TABS: 5.0000 mg | ORAL_TABLET | Freq: Once | ORAL | Status: DC | PRN

## 2022-05-23 MED ORDER — FENTANYL CITRATE (PF) 100 MCG/2ML IJ SOLN
INTRAMUSCULAR | Status: AC
  Filled 2022-05-23: qty 2

## 2022-05-23 MED ORDER — ONDANSETRON HCL 4 MG/2ML IJ SOLN
INTRAMUSCULAR | Status: DC | PRN
  Administered 2022-05-23: 4 mg via INTRAVENOUS

## 2022-05-23 MED ORDER — CIPROFLOXACIN IN D5W 400 MG/200ML IV SOLN: 400.0000 mg | Freq: Once | INTRAVENOUS | Status: DC

## 2022-05-23 MED ORDER — SODIUM CHLORIDE 0.9 % IR SOLN
Status: DC | PRN
  Administered 2022-05-23: 3000 mL via INTRAVESICAL

## 2022-05-23 MED ORDER — MIDAZOLAM HCL 2 MG/2ML IJ SOLN
INTRAMUSCULAR | Status: AC
  Filled 2022-05-23: qty 2

## 2022-05-23 MED ORDER — PROPOFOL 10 MG/ML IV BOLUS
INTRAVENOUS | Status: DC | PRN
  Administered 2022-05-23: 170 mg via INTRAVENOUS
  Administered 2022-05-23: 20 mg via INTRAVENOUS

## 2022-05-23 MED ORDER — MIDAZOLAM HCL 2 MG/2ML IJ SOLN
INTRAMUSCULAR | Status: DC | PRN
  Administered 2022-05-23: 1 mg via INTRAVENOUS

## 2022-05-23 MED ORDER — LACTATED RINGERS IV SOLN: INTRAVENOUS | Status: DC

## 2022-05-23 MED ORDER — CIPROFLOXACIN IN D5W 400 MG/200ML IV SOLN
INTRAVENOUS | Status: AC
  Filled 2022-05-23: qty 200

## 2022-05-23 MED ORDER — ACETAMINOPHEN 500 MG PO TABS
1000.0000 mg | ORAL_TABLET | Freq: Once | ORAL | Status: AC
  Administered 2022-05-23: 1000 mg via ORAL

## 2022-05-23 MED ORDER — OXYCODONE HCL 5 MG/5ML PO SOLN: 5.0000 mg | Freq: Once | ORAL | Status: DC | PRN

## 2022-05-23 MED ORDER — PROPOFOL 10 MG/ML IV BOLUS
INTRAVENOUS | Status: AC
  Filled 2022-05-23: qty 20

## 2022-05-23 MED ORDER — DEXAMETHASONE SODIUM PHOSPHATE 10 MG/ML IJ SOLN
INTRAMUSCULAR | Status: DC | PRN
  Administered 2022-05-23: 5 mg via INTRAVENOUS

## 2022-05-23 MED ORDER — LIDOCAINE 2% (20 MG/ML) 5 ML SYRINGE
INTRAMUSCULAR | Status: DC | PRN
  Administered 2022-05-23: 50 mg via INTRAVENOUS

## 2022-05-23 MED ORDER — FENTANYL CITRATE (PF) 250 MCG/5ML IJ SOLN
INTRAMUSCULAR | Status: DC | PRN
  Administered 2022-05-23: 25 ug via INTRAVENOUS
  Administered 2022-05-23: 50 ug via INTRAVENOUS
  Administered 2022-05-23: 25 ug via INTRAVENOUS

## 2022-05-23 MED ORDER — CIPROFLOXACIN IN D5W 400 MG/200ML IV SOLN
400.0000 mg | INTRAVENOUS | Status: AC
  Administered 2022-05-23: 400 mg via INTRAVENOUS

## 2022-05-23 MED ORDER — ACETAMINOPHEN 500 MG PO TABS
ORAL_TABLET | ORAL | Status: AC
  Filled 2022-05-23: qty 2

## 2022-05-23 SURGICAL SUPPLY — 21 items
BAG DRAIN URO-CYSTO SKYTR STRL (DRAIN) ×1 IMPLANT
BAG DRN UROCATH (DRAIN) ×1
BASKET ZERO TIP NITINOL 2.4FR (BASKET) IMPLANT
BSKT STON RTRVL ZERO TP 2.4FR (BASKET) ×1
CATH URETL OPEN 5X70 (CATHETERS) ×1 IMPLANT
CLOTH BEACON ORANGE TIMEOUT ST (SAFETY) ×1 IMPLANT
DRSG TEGADERM 4X4.75 (GAUZE/BANDAGES/DRESSINGS) IMPLANT
EXTRACTOR STONE 1.7FRX115CM (UROLOGICAL SUPPLIES) IMPLANT
GLOVE BIO SURGEON STRL SZ 6.5 (GLOVE) ×1 IMPLANT
GOWN STRL REUS W/TWL LRG LVL3 (GOWN DISPOSABLE) ×1 IMPLANT
GUIDEWIRE STR DUAL SENSOR (WIRE) ×1 IMPLANT
IV NS IRRIG 3000ML ARTHROMATIC (IV SOLUTION) ×1 IMPLANT
KIT TURNOVER CYSTO (KITS) ×1 IMPLANT
MANIFOLD NEPTUNE II (INSTRUMENTS) ×1 IMPLANT
PACK CYSTO (CUSTOM PROCEDURE TRAY) ×1 IMPLANT
SHEATH NAVIGATOR HD 12/14X36 (SHEATH) IMPLANT
STENT URET 6FRX24 CONTOUR (STENTS) IMPLANT
TRACTIP FLEXIVA PULS ID 200XHI (Laser) IMPLANT
TRACTIP FLEXIVA PULSE ID 200 (Laser) ×1
TUBE CONNECTING 12X1/4 (SUCTIONS) ×1 IMPLANT
TUBING UROLOGY SET (TUBING) ×1 IMPLANT

## 2022-05-23 NOTE — Anesthesia Preprocedure Evaluation (Addendum)
Anesthesia Evaluation  Patient identified by MRN, date of birth, ID band Patient awake    Reviewed: Allergy & Precautions, NPO status , Patient's Chart, lab work & pertinent test results  History of Anesthesia Complications Negative for: history of anesthetic complications  Airway Mallampati: II  TM Distance: >3 FB Neck ROM: Full    Dental no notable dental hx.    Pulmonary neg pulmonary ROS   Pulmonary exam normal        Cardiovascular hypertension, Pt. on medications Normal cardiovascular exam     Neuro/Psych negative neurological ROS  negative psych ROS   GI/Hepatic Neg liver ROS,GERD  Medicated,,  Endo/Other  negative endocrine ROS    Renal/GU RIGHT URETERAL CALCULUS  negative genitourinary   Musculoskeletal negative musculoskeletal ROS (+)    Abdominal   Peds  Hematology negative hematology ROS (+)   Anesthesia Other Findings Day of surgery medications reviewed with patient.  Reproductive/Obstetrics negative OB ROS                              Anesthesia Physical Anesthesia Plan  ASA: 2  Anesthesia Plan: General   Post-op Pain Management: Tylenol PO (pre-op)*   Induction: Intravenous  PONV Risk Score and Plan: 3 and Treatment may vary due to age or medical condition, Ondansetron, Dexamethasone and Midazolam  Airway Management Planned: LMA  Additional Equipment: None  Intra-op Plan:   Post-operative Plan: Extubation in OR  Informed Consent: I have reviewed the patients History and Physical, chart, labs and discussed the procedure including the risks, benefits and alternatives for the proposed anesthesia with the patient or authorized representative who has indicated his/her understanding and acceptance.     Dental advisory given  Plan Discussed with: CRNA  Anesthesia Plan Comments:         Anesthesia Quick Evaluation

## 2022-05-23 NOTE — Transfer of Care (Signed)
Immediate Anesthesia Transfer of Care Note  Patient: Melissa Hobbs  Procedure(s) Performed: CYSTOSCOPY/URETEROSCOPY/HOLMIUM LASER/STENT  EXCHANGE (Right)  Patient Location: PACU  Anesthesia Type:General  Level of Consciousness: awake, alert , and oriented  Airway & Oxygen Therapy: Patient Spontanous Breathing  Post-op Assessment: Report given to RN and Post -op Vital signs reviewed and stable  Post vital signs: Reviewed and stable  Last Vitals:  Vitals Value Taken Time  BP 140/87 05/23/22 1309  Temp    Pulse 77 05/23/22 1313  Resp 13 05/23/22 1313  SpO2 96 % 05/23/22 1313  Vitals shown include unvalidated device data.  Last Pain:  Vitals:   05/23/22 0941  TempSrc: Oral  PainSc: 0-No pain      Patients Stated Pain Goal: 4 ([id number])  Complications: No notable events documented.

## 2022-05-23 NOTE — Anesthesia Procedure Notes (Signed)
Procedure Name: LMA Insertion Date/Time: 05/23/2022 12:18 PM  Performed by: Clearnce Sorrel, CRNAPre-anesthesia Checklist: Patient identified, Emergency Drugs available, Suction available and Patient being monitored Patient Re-evaluated:Patient Re-evaluated prior to induction Oxygen Delivery Method: Circle System Utilized Preoxygenation: Pre-oxygenation with 100% oxygen Induction Type: IV induction Ventilation: Mask ventilation without difficulty LMA: LMA inserted LMA Size: 4.0 Number of attempts: 1 Airway Equipment and Method: Bite block Placement Confirmation: positive ETCO2 Tube secured with: Tape Dental Injury: Teeth and Oropharynx as per pre-operative assessment

## 2022-05-23 NOTE — Interval H&P Note (Signed)
History and Physical Interval Note:  05/23/2022 11:45 AM  Melissa Hobbs  has presented today for surgery, with the diagnosis of RIGHT URETERAL CALCULUS.  The various methods of treatment have been discussed with the patient and family. After consideration of risks, benefits and other options for treatment, the patient has consented to  Procedure(s) with comments: CYSTOSCOPY/URETEROSCOPY/HOLMIUM LASER/STENT  EXCHANGE (Right) - 1 HR as a surgical intervention.  The patient's history has been reviewed, patient examined, no change in status, stable for surgery.  I have reviewed the patient's chart and labs.  Questions were answered to the patient's satisfaction.     Grayer Sproles D Rama Mcclintock

## 2022-05-23 NOTE — Discharge Instructions (Addendum)
DISCHARGE INSTRUCTIONS FOR KIDNEY STONE/URETERAL STENT   MEDICATIONS:  1. Resume all your other meds from home  2. AZO over the counter can help with the burning/stinging when you urinate. 3. Tramadol is for moderate/severe pain, otherwise taking up to 1000 mg every 6 hours of plainTylenol will help treat your pain.  .    ACTIVITY:  1. No strenuous activity x 1week  2. No driving while on narcotic pain medications  3. Drink plenty of water  4. Continue to walk at home - you can still get blood clots when you are at home, so keep active, but don't over do it.  5. May return to work/school tomorrow or when you feel ready   BATHING:  1. You can shower and we recommend daily showers   SIGNS/SYMPTOMS TO CALL:  Please call us if you have a fever greater than 101.5, uncontrolled nausea/vomiting, uncontrolled pain, dizziness, unable to urinate, bloody urine, chest pain, shortness of breath, leg swelling, leg pain, redness around wound, drainage from wound, or any other concerns or questions.   You can reach [place] at [phone].   FOLLOW-UP:  1. Your stent will be removed in the office next week Post Anesthesia Home Care Instructions  Activity: Get plenty of rest for the remainder of the day. A responsible adult should stay with you for 24 hours following the procedure.  For the next 24 hours, DO NOT: -Drive a car -Paediatric nurse -Drink alcoholic beverages -Take any medication unless instructed by your physician -Make any legal decisions or sign important papers.  Meals: Start with liquid foods such as gelatin or soup. Progress to regular foods as tolerated. Avoid greasy, spicy, heavy foods. If nausea and/or vomiting occur, drink only clear liquids until the nausea and/or vomiting subsides. Call your physician if vomiting continues.  Special Instructions/Symptoms: Your throat may feel dry or sore from the anesthesia or the breathing tube placed in your throat during surgery. If this  causes discomfort, gargle with warm salt water. The discomfort should disappear within 24 hours.    o

## 2022-05-23 NOTE — Anesthesia Postprocedure Evaluation (Signed)
Anesthesia Post Note  Patient: Melissa Hobbs  Procedure(s) Performed: CYSTOSCOPY/URETEROSCOPY/HOLMIUM LASER/STENT  EXCHANGE (Right)     Patient location during evaluation: PACU Anesthesia Type: General Level of consciousness: awake and alert Pain management: pain level controlled Vital Signs Assessment: post-procedure vital signs reviewed and stable Respiratory status: spontaneous breathing, nonlabored ventilation and respiratory function stable Cardiovascular status: blood pressure returned to baseline Postop Assessment: no apparent nausea or vomiting Anesthetic complications: no   No notable events documented.  Last Vitals:  Vitals:   05/23/22 1330 05/23/22 1337  BP: (!) 141/56   Pulse: 69 69  Resp: 13 18  Temp:    SpO2: 95% 97%    Last Pain:  Vitals:   05/23/22 1337  TempSrc:   PainSc: 0-No pain                 Marthenia Rolling

## 2022-05-23 NOTE — Op Note (Signed)
Preoperative diagnosis: right ureteral calculi  Postoperative diagnosis: right ureteral calculi  Procedure:  Cystoscopy right ureteroscopy, laser lithotripsy, basket stone extraction right 64F x 24cm ureteral stent placement - no tether right retrograde pyelography with interpretation  Surgeon: Jacalyn Lefevre, MD  Anesthesia: General  Complications: None  Intraoperative findings:  Normal urethra Bilateral orthotropic ureteral orifices Bladder mucosa normal without masses   EBL: Minimal  Specimens: right ureteral calculus  Disposition of specimens: [facility] for stone analysis  Indication: Melissa Hobbs is a 67 y.o.   patient with a large right renal calculus lithotripsy and had residual stone burden alongside the stent now presents for repeat ureteroscopy.  After reviewing the management options for treatment, the patient elected to proceed with the above surgical procedure(s). We have discussed the potential benefits and risks of the procedure, side effects of the proposed treatment, the likelihood of the patient achieving the goals of the procedure, and any potential problems that might occur during the procedure or recuperation. Informed consent has been obtained.   Description of procedure:  The patient was taken to the operating room and general anesthesia was induced.  The patient was placed in the dorsal lithotomy position, prepped and draped in the usual sterile fashion, and preoperative antibiotics were administered. A preoperative time-out was performed.   Cystourethroscopy was performed.  The patient's urethra was examined and was normal. The bladder was then systematically examined in its entirety. There was no evidence for any bladder tumors, stones, or other mucosal pathology.    Attention then turned to the right ureteral orifice and the existing ureteral stent was grasped with a grasper and brought to the urethral meatus.  A 0.38 sensor wire was  then advanced through the ureteral stent and into the ureter and renal pelvis with fluoroscopic guidance.  The stent was examined and deemed to be intact and then discarded.  Next semirigid ureteroscopy took place alongside the sensor wire and there were several small stone fragments encountered in the ureter.  A 242 holmium laser fiber was then used to fragment the stones to even smaller pieces.  A 0 tip basket was then used to remove them.  After the no further fragments in the ureter a second sensor wire was advanced through the semirigid ureteroscope.  The ureteroscope was removed.  A ureteral access sheath was placed over the second wire and advanced to the proximal ureter with fluoroscopic guidance.  The inner sheath and wire removed.  The existing wire was used as a Chiropodist.  Next flexible ureteroscopy then took place.  There was no large fragments seen in the kidney.  There was a small amount of dust seen in the renal pelvises that were too small to basket.  The ureteroscope was then removed and unison with the access sheath on the way out.  Care was taken to examine the ureter and no residual stone fragments or trauma was noted to the ureter.    The wire was then backloaded through the cystoscope and a ureteral stent was advance over the wire using Seldinger technique.  The stent was positioned appropriately under fluoroscopic and cystoscopic guidance.  The wire was then removed with an adequate stent curl noted in the renal pelvis as well as in the bladder.  The bladder was then emptied and the procedure ended.  The patient appeared to tolerate the procedure well and without complications.  The patient was able to be awakened and transferred to the recovery unit in satisfactory condition.  Disposition:  Instructions for removing the stent have been provided to the patient.

## 2022-05-23 NOTE — H&P (Signed)
CC/HPI: cc: urolithiasis   04/11/22: 67 yo woman with hx of urolithiasis presented to ED with flank pain found to have 3 mm right UVJ calculus and bilateral non-obstructing renal calculi. Her pain is controlled with oral pain medication. She has had 2 kidney stone episodes in the past but passed them without any kind of intervention. She does not currently have fevers or chills   05/09/22: Ms. Kerbo underwent a bilateral ureteroscopy a week ago. 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 the stones seemed to dust well, but she presents today for KUB in case a second look is needed. She currently has bilateral stents and is tolerating them, but does report that they are very uncomfortable. She denies gross hematuria and flank pain. She denies fevers and chills.   05/18/22: 67 year old woman with a large right renal pelvis calculus as well as 3 mm right UVJ calculus here for follow-up after bilateral ureteroscopy. Patient has residual stone fragments seen alongside the right ureteral stent however is improved from last week. She continues to have urinary urgency and discomfort.     ALLERGIES: Penicillin    MEDICATIONS: Tamsulosin Hcl 0.4 mg capsule 1 capsule PO Q HS  Albuterol Sulfate  Amlodipine Besylate 5 mg tablet  Benzonatate 200 mg capsule  Cholecalciferol  Oxycodone Hcl 5 mg tablet 1 tablet PO Q 6 H PRN severe kidney stone pain  Oxycodone-Acetaminophen 5 mg-325 mg tablet  Tums     GU PSH: Hysterectomy Ureteroscopic laser litho, Bilateral - 05/01/2022     NON-GU PSH: None   GU PMH: Renal calculus - 05/09/2022, - 04/11/2022 Ureteral calculus - 05/09/2022, - 04/11/2022      PMH Notes: Kidney stones.   NON-GU PMH: Hypercholesterolemia Hypertension    FAMILY HISTORY: None   SOCIAL HISTORY: Marital Status: Married Preferred Language: English; Ethnicity: Not Hispanic Or Latino; Race: White Current Smoking Status: Patient has never  smoked.   Tobacco Use Assessment Completed: Used Tobacco in last 30 days? Has never drank.  Drinks 1 caffeinated drink per day.    REVIEW OF SYSTEMS:    GU Review Female:   Patient denies frequent urination, hard to postpone urination, burning /pain with urination, get up at night to urinate, leakage of urine, stream starts and stops, trouble starting your stream, have to strain to urinate, and being pregnant.  Gastrointestinal (Upper):   Patient denies nausea, vomiting, and indigestion/ heartburn.  Gastrointestinal (Lower):   Patient denies diarrhea and constipation.  Constitutional:   Patient denies fever, night sweats, weight loss, and fatigue.  Skin:   Patient denies skin rash/ lesion and itching.  Eyes:   Patient denies blurred vision and double vision.  Ears/ Nose/ Throat:   Patient denies sore throat and sinus problems.  Hematologic/Lymphatic:   Patient denies swollen glands and easy bruising.  Cardiovascular:   Patient denies leg swelling and chest pains.  Respiratory:   Patient denies cough and shortness of breath.  Endocrine:   Patient denies excessive thirst.  Musculoskeletal:   Patient denies back pain and joint pain.  Neurological:   Patient denies headaches and dizziness.  Psychologic:   Patient denies depression and anxiety.   VITAL SIGNS: None   GU PHYSICAL EXAMINATION:    External Genitalia: No hirsutism, no rash, no scarring, no cyst, no erythematous lesion, no papular lesion, no blanched lesion, no warty lesion. No edema.  Urethra: No tenderness, no mass, no scarring. No hypermobility.  No leakage.   MULTI-SYSTEM PHYSICAL EXAMINATION:    Constitutional: Well-nourished. No physical deformities. Normally developed. Good grooming.  Neck: Neck symmetrical, not swollen. Normal tracheal position.  Respiratory: No labored breathing, no use of accessory muscles.   Skin: No paleness, no jaundice, no cyanosis. No lesion, no ulcer, no rash.  Neurologic / Psychiatric: Oriented  to time, oriented to place, oriented to person. No depression, no anxiety, no agitation.  Eyes: Normal conjunctivae. Normal eyelids.  Ears, Nose, Mouth, and Throat: Left ear no scars, no lesions, no masses. Right ear no scars, no lesions, no masses. Nose no scars, no lesions, no masses. Normal hearing. Normal lips.  Musculoskeletal: Normal gait and station of head and neck.     Complexity of Data:  Records Review:   Previous Patient Records  Urine Test Review:   Urinalysis  X-Ray Review: KUB: Reviewed Films. Discussed With Patient. Several stone fragments seen along the course of the right ureteral stent. It is improved however from last KUB.    PROCEDURES:         Flexible Cystoscopy Left Stent Removal - 52310  Risks, benefits, and some of the potential complications of the procedure were discussed at length with the patient including infection, bleeding, voiding discomfort, urinary retention, fever, chills, sepsis, and others. All questions were answered. Informed consent was obtained. Antibiotic prophylaxis was given. Sterile technique and intraurethral analgesia were used.  Meatus:  Normal size. Normal location. Normal condition.  Urethra:  No hypermobility. No leakage.  Ureteral Orifices:  Normal location. Normal size. Normal shape. Effluxed clear urine.  Bladder:  No trabeculation. No tumors. Normal mucosa. No stones.  The left ureteral stent was carefully removed with a grasping instrument.    The lower urinary tract was carefully examined. The procedure was well-tolerated and without complications. Antibiotic instructions were given. Instructions were given to call the office immediately for bloody urine, difficulty urinating, urinary retention, painful or frequent urination, fever, chills, nausea, vomiting or other illness. The patient stated that she understood these instructions and would comply with them.          KUB - [id number]  A single view of the abdomen is obtained.       Patient confirmed No Neulasta OnPro Device.     ASSESSMENT:      ICD-10 Details  1 GU:   Renal calculus - N20.0 Chronic, Stable  2   Ureteral calculus - A999333 Acute, Uncomplicated   PLAN:           Document Letter(s):  Created for Patient: Clinical Summary         Notes:   Urolithiasis:  -Left ureteral stent removed today and patient was given antibiotic to take for infection prevention  -KUB shows residual stone fragments alongside the right ureteral stent. We discussed proceeding with a repeat ureteroscopy, laser lithotripsy and stent exchange. Risks and benefits have been discussed. Patient is agreeable with plan.  -gemtesa samples given for urgency secondary to stent

## 2022-05-24 ENCOUNTER — Encounter (HOSPITAL_BASED_OUTPATIENT_CLINIC_OR_DEPARTMENT_OTHER): Payer: Self-pay | Admitting: Urology

## 2022-05-30 DIAGNOSIS — N201 Calculus of ureter: Secondary | ICD-10-CM | POA: Diagnosis not present

## 2022-07-17 DIAGNOSIS — N200 Calculus of kidney: Secondary | ICD-10-CM | POA: Diagnosis not present

## 2022-08-06 IMAGING — DX DG CHEST 2V
2 series · 2 of 2 positions shown · non-contrast
Comparison: None.

CLINICAL DATA: Shortness of breath and decreased oxygen saturation.
The patient tested positive for TVL60-6D 9 days ago.

EXAM:
CHEST - 2 VIEW

[chest pa]
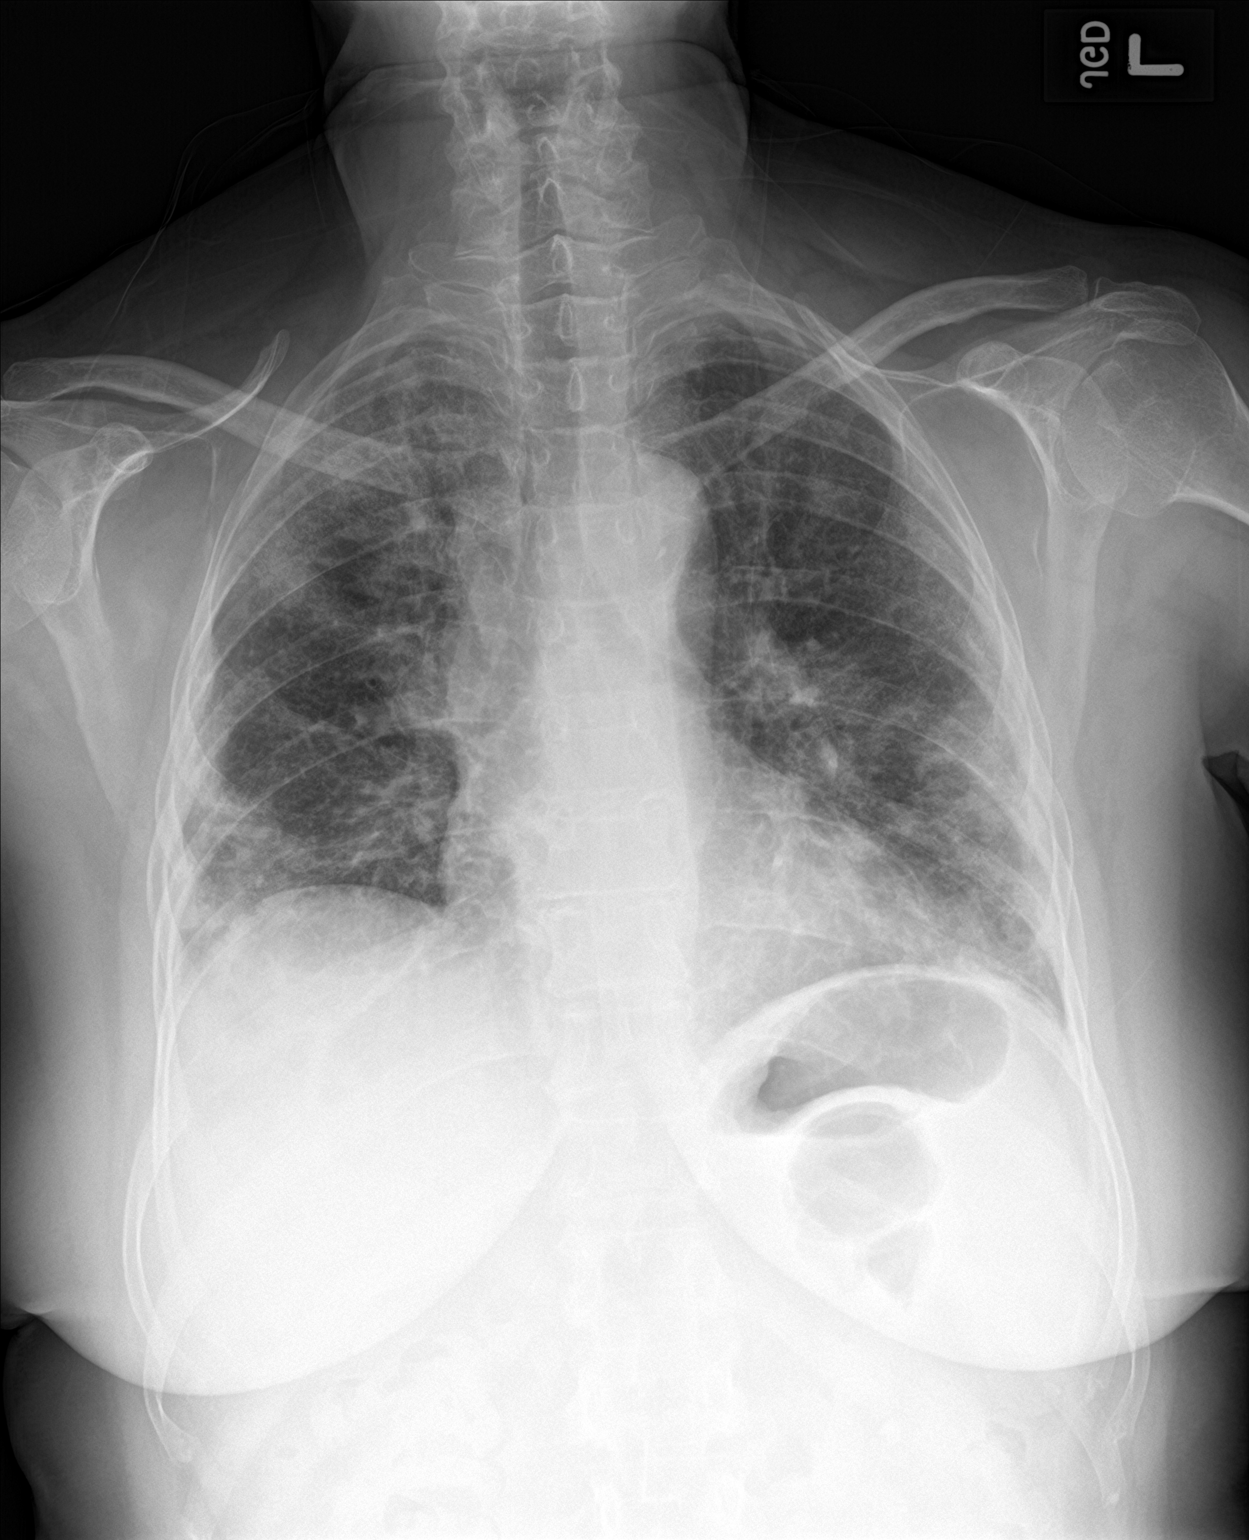

[chest lat]
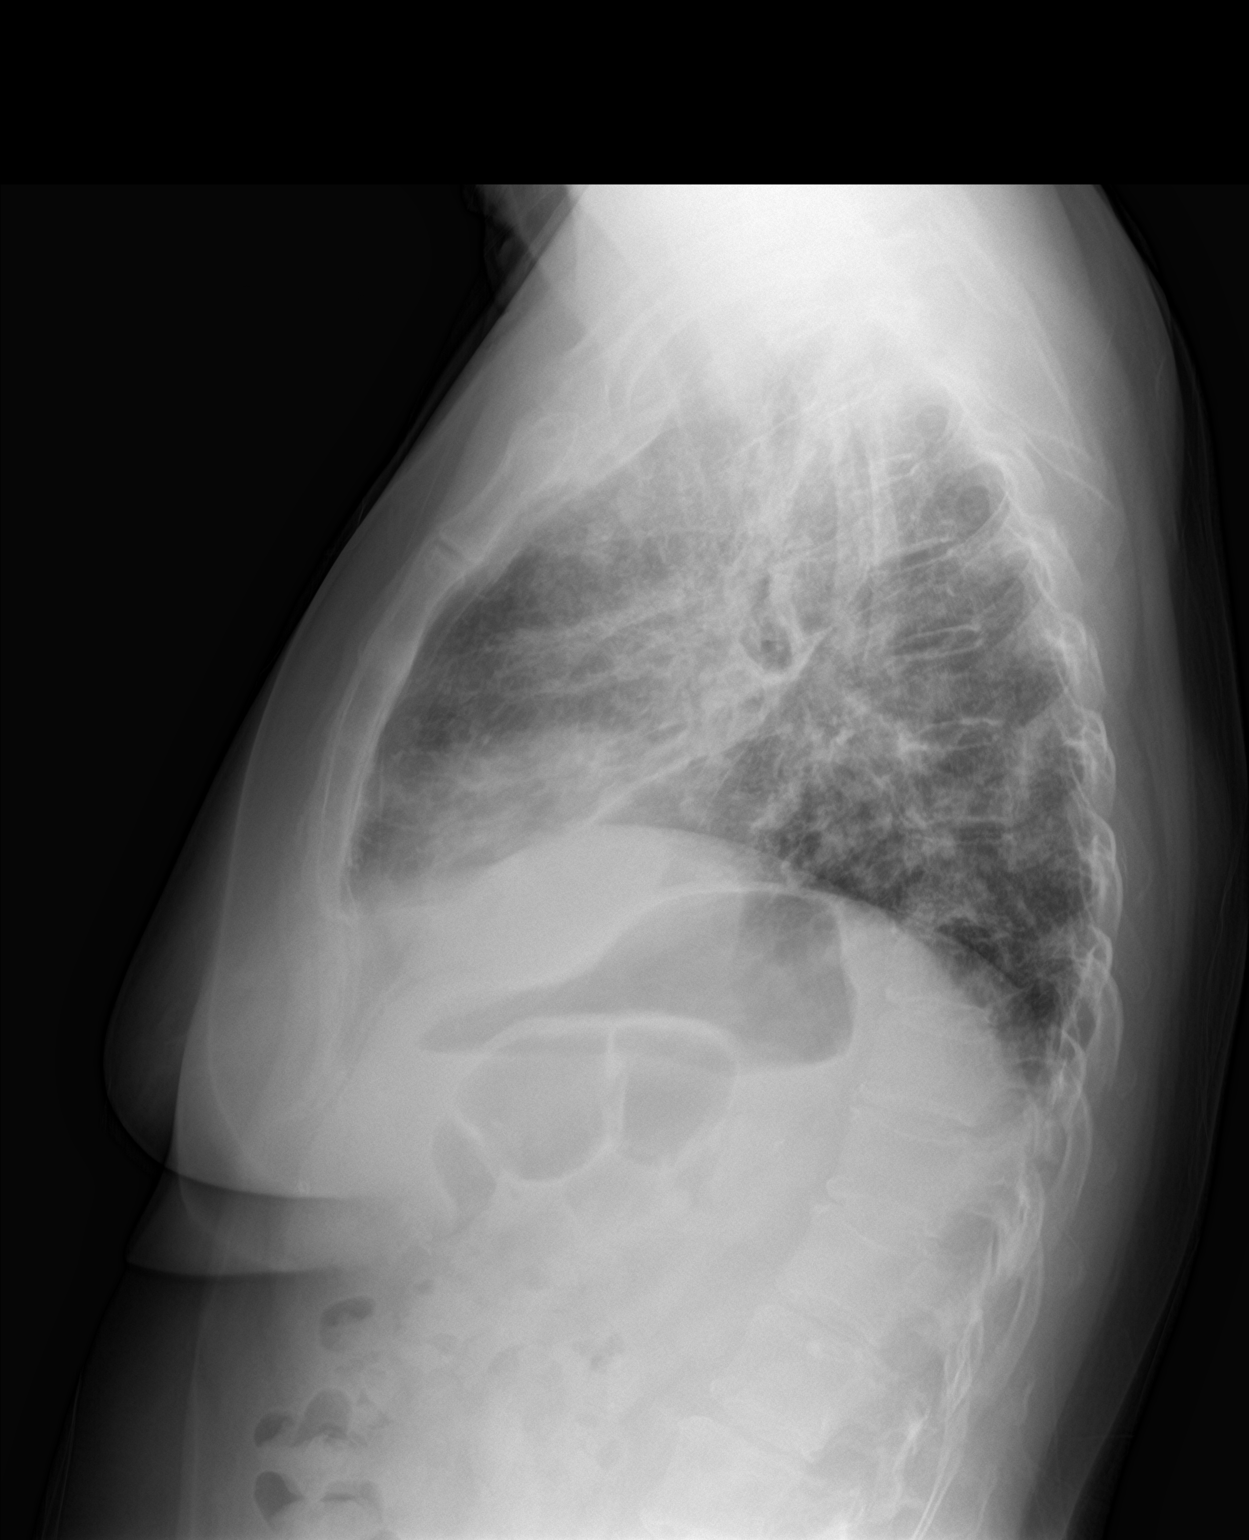

[2 of 2 positions shown; findings below may reference images not displayed]

FINDINGS: The patient has multifocal bilateral airspace disease with a
peripheral predominance. No pneumothorax or pleural effusion. Heart
size is normal. No acute or focal bony abnormality.
IMPRESSION: Bilateral pneumonia has an appearance compatible with TVL60-6D
infection.
